# Patient Record
Sex: Male | Born: 1955 | Race: White | Hispanic: No | Smoking: Former smoker
Health system: Southern US, Community
[De-identification: ages and names within clinical notes are randomized; demographics above are authoritative.]

## PROBLEM LIST (undated history)

## (undated) DIAGNOSIS — M2550 Pain in unspecified joint: Secondary | ICD-10-CM

## (undated) DIAGNOSIS — F32A Depression, unspecified: Secondary | ICD-10-CM

## (undated) DIAGNOSIS — R269 Unspecified abnormalities of gait and mobility: Secondary | ICD-10-CM

## (undated) DIAGNOSIS — H04123 Dry eye syndrome of bilateral lacrimal glands: Secondary | ICD-10-CM

## (undated) DIAGNOSIS — F329 Major depressive disorder, single episode, unspecified: Secondary | ICD-10-CM

## (undated) DIAGNOSIS — I10 Essential (primary) hypertension: Secondary | ICD-10-CM

## (undated) DIAGNOSIS — R413 Other amnesia: Secondary | ICD-10-CM

## (undated) HISTORY — DX: Other amnesia: R41.3

## (undated) HISTORY — DX: Dry eye syndrome of bilateral lacrimal glands: H04.123

## (undated) HISTORY — DX: Unspecified abnormalities of gait and mobility: R26.9

## (undated) HISTORY — DX: Depression, unspecified: F32.A

## (undated) HISTORY — PX: HAND SURGERY: SHX662

## (undated) HISTORY — DX: Pain in unspecified joint: M25.50

---
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
## 1998-07-31 ENCOUNTER — Emergency Department (HOSPITAL_COMMUNITY): Admission: EM | Admit: 1998-07-31 | Discharge: 1998-07-31 | Payer: Self-pay | Admitting: Emergency Medicine

## 1998-07-31 ENCOUNTER — Emergency Department (HOSPITAL_COMMUNITY): Admission: EM | Admit: 1998-07-31 | Discharge: 1998-08-01 | Payer: Self-pay | Admitting: Emergency Medicine

## 1999-02-02 ENCOUNTER — Emergency Department (HOSPITAL_COMMUNITY): Admission: EM | Admit: 1999-02-02 | Discharge: 1999-02-03 | Payer: Self-pay | Admitting: Emergency Medicine

## 1999-03-22 ENCOUNTER — Emergency Department (HOSPITAL_COMMUNITY): Admission: EM | Admit: 1999-03-22 | Discharge: 1999-03-22 | Payer: Self-pay | Admitting: Emergency Medicine

## 2005-07-01 ENCOUNTER — Inpatient Hospital Stay (HOSPITAL_COMMUNITY): Admission: EM | Admit: 2005-07-01 | Discharge: 2005-07-08 | Payer: Self-pay | Admitting: Emergency Medicine

## 2007-07-30 ENCOUNTER — Emergency Department (HOSPITAL_COMMUNITY): Admission: EM | Admit: 2007-07-30 | Discharge: 2007-07-30 | Payer: Self-pay | Admitting: Emergency Medicine

## 2007-11-28 ENCOUNTER — Emergency Department (HOSPITAL_COMMUNITY): Admission: EM | Admit: 2007-11-28 | Discharge: 2007-11-28 | Payer: Self-pay | Admitting: Emergency Medicine

## 2011-01-16 ENCOUNTER — Emergency Department (HOSPITAL_COMMUNITY)
Admission: EM | Admit: 2011-01-16 | Discharge: 2011-01-17 | Disposition: A | Discharge status: Home / Self Care | Payer: Self-pay | Attending: Emergency Medicine | Admitting: Emergency Medicine

## 2011-01-16 DIAGNOSIS — Z79899 Other long term (current) drug therapy: Secondary | ICD-10-CM | POA: Insufficient documentation

## 2011-01-16 DIAGNOSIS — R000 Tachycardia, unspecified: Secondary | ICD-10-CM | POA: Insufficient documentation

## 2011-01-16 DIAGNOSIS — R51 Headache: Secondary | ICD-10-CM | POA: Insufficient documentation

## 2011-01-16 DIAGNOSIS — R002 Palpitations: Secondary | ICD-10-CM | POA: Insufficient documentation

## 2011-01-16 DIAGNOSIS — I10 Essential (primary) hypertension: Secondary | ICD-10-CM | POA: Insufficient documentation

## 2011-02-18 ENCOUNTER — Inpatient Hospital Stay (INDEPENDENT_AMBULATORY_CARE_PROVIDER_SITE_OTHER)
Admission: RE | Admit: 2011-02-18 | Discharge: 2011-02-18 | Disposition: A | Discharge status: Home / Self Care | Payer: Self-pay | Source: Ambulatory Visit | Attending: Family Medicine | Admitting: Family Medicine

## 2011-02-18 DIAGNOSIS — I10 Essential (primary) hypertension: Secondary | ICD-10-CM
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
## 2011-08-01 ENCOUNTER — Emergency Department (HOSPITAL_COMMUNITY)
Admission: EM | Admit: 2011-08-01 | Discharge: 2011-08-01 | Disposition: A | Discharge status: Home / Self Care | Payer: Self-pay | Attending: Emergency Medicine | Admitting: Emergency Medicine

## 2011-08-01 ENCOUNTER — Emergency Department (HOSPITAL_COMMUNITY): Payer: Self-pay

## 2011-08-01 DIAGNOSIS — S0003XA Contusion of scalp, initial encounter: Secondary | ICD-10-CM | POA: Insufficient documentation

## 2011-08-01 DIAGNOSIS — I10 Essential (primary) hypertension: Secondary | ICD-10-CM | POA: Insufficient documentation

## 2011-08-01 DIAGNOSIS — R279 Unspecified lack of coordination: Secondary | ICD-10-CM | POA: Insufficient documentation

## 2011-08-01 DIAGNOSIS — S0180XA Unspecified open wound of other part of head, initial encounter: Secondary | ICD-10-CM | POA: Insufficient documentation

## 2011-08-01 DIAGNOSIS — W11XXXA Fall on and from ladder, initial encounter: Secondary | ICD-10-CM | POA: Insufficient documentation

## 2011-08-01 DIAGNOSIS — F1010 Alcohol abuse, uncomplicated: Secondary | ICD-10-CM | POA: Insufficient documentation

## 2011-08-01 DIAGNOSIS — M542 Cervicalgia: Secondary | ICD-10-CM | POA: Insufficient documentation

## 2011-08-01 DIAGNOSIS — Z79899 Other long term (current) drug therapy: Secondary | ICD-10-CM | POA: Insufficient documentation

## 2011-08-01 DIAGNOSIS — R471 Dysarthria and anarthria: Secondary | ICD-10-CM | POA: Insufficient documentation

## 2011-09-12 LAB — BASIC METABOLIC PANEL
CO2: 25
Chloride: 108
GFR calc Af Amer: >60
GFR calc non Af Amer: >60
Sodium: 143

## 2011-09-12 LAB — RAPID URINE DRUG SCREEN, HOSP PERFORMED
Amphetamines: NOT DETECTED
Barbiturates: NOT DETECTED
Benzodiazepines: NOT DETECTED
Opiates: NOT DETECTED

## 2011-09-12 LAB — ETHANOL: Alcohol, Ethyl (B): 275 — ABNORMAL HIGH

## 2012-03-18 ENCOUNTER — Encounter (HOSPITAL_COMMUNITY): Payer: Self-pay | Admitting: Emergency Medicine

## 2012-03-18 ENCOUNTER — Emergency Department (INDEPENDENT_AMBULATORY_CARE_PROVIDER_SITE_OTHER)
Admission: EM | Admit: 2012-03-18 | Discharge: 2012-03-18 | Disposition: A | Discharge status: Home / Self Care | Payer: Self-pay | Source: Home / Self Care | Attending: Family Medicine | Admitting: Family Medicine

## 2012-03-18 DIAGNOSIS — S0502XA Injury of conjunctiva and corneal abrasion without foreign body, left eye, initial encounter: Secondary | ICD-10-CM

## 2012-03-18 DIAGNOSIS — S058X9A Other injuries of unspecified eye and orbit, initial encounter: Secondary | ICD-10-CM

## 2012-03-18 HISTORY — DX: Essential (primary) hypertension: I10

## 2012-03-18 MED ORDER — TETRACAINE HCL 0.5 % OP SOLN
OPHTHALMIC | Status: AC
  Filled 2012-03-18: qty 2

## 2012-03-18 MED ORDER — POLYMYXIN B-TRIMETHOPRIM 10000-0.1 UNIT/ML-% OP SOLN: 1.0000 [drp] | OPHTHALMIC | Status: AC

## 2012-03-18 NOTE — ED Notes (Addendum)
PT HERE WITH LEFT EYE IRRITATION WITH SENSITIVE TO LIGHT,DRY,ITCHY AND BLURRY VISION THAT STARTED X 2 DYS AGO.PT UNSURE IF FOREIGN OBJECT GOT IN EYE BUT DENIES DRAINAGE OR DIZZINESS.PT ALSO HAS ELEVATED BP 154/111 AND STATES I TAKE MY MEDS DAILY FOR BP.MILD H/A NOTED WITH SX'S.PT HAS BEEN USING EYE DROPS
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
## 2012-03-18 NOTE — Discharge Instructions (Signed)
Use eyedrops as directed. You may also flush eye regularly, using normal saline eye drops, PRIOR to applying the antibiotic drops. Exercise proper hygiene with handwashing, not touching the face or eye, not sharing towels or other clothing. Return to care, or call the Ophthalmology practice listed on your discharge papers, should your symptoms not improve, or worsen in any way, or any visual disturbance.

## 2012-03-25 ENCOUNTER — Emergency Department (HOSPITAL_COMMUNITY)
Admission: EM | Admit: 2012-03-25 | Discharge: 2012-03-25 | Disposition: A | Discharge status: Home / Self Care | Payer: Self-pay | Source: Home / Self Care | Attending: Emergency Medicine | Admitting: Emergency Medicine

## 2012-03-25 ENCOUNTER — Encounter (HOSPITAL_COMMUNITY): Payer: Self-pay | Admitting: Emergency Medicine

## 2012-03-25 DIAGNOSIS — S0502XA Injury of conjunctiva and corneal abrasion without foreign body, left eye, initial encounter: Secondary | ICD-10-CM

## 2012-03-25 DIAGNOSIS — S058X9A Other injuries of unspecified eye and orbit, initial encounter: Secondary | ICD-10-CM

## 2012-03-25 MED ORDER — CIPROFLOXACIN HCL 0.3 % OP SOLN: 1.0000 [drp] | OPHTHALMIC | Status: AC

## 2012-03-25 MED ORDER — TETRACAINE HCL 0.5 % OP SOLN
OPHTHALMIC | Status: AC
  Filled 2012-03-25: qty 2

## 2012-03-25 MED ORDER — HYDROCODONE-ACETAMINOPHEN 5-325 MG PO TABS: 2.0000 | ORAL_TABLET | ORAL | Status: AC | PRN

## 2012-03-25 NOTE — ED Provider Notes (Signed)
History     CSN: [id number]  Arrival date & time 03/25/12  1619   First MD Initiated Contact with Patient 03/25/12 1621      Chief Complaint  Patient presents with  . Eye Pain    (Consider location/radiation/quality/duration/timing/severity/associated sxs/prior treatment) HPI Comments: The patient reports he returned to the urgent care today with continued irritation of his left eye.  Was seen in the urgent care last week with the same problem.  States approximately 9 days ago, he felt that he got something in his eye, attempted to blink it out and wash out, but since then has had a foreign body sensation in his eye and intermittent sharp pains.  Patient has been using his antibiotic drops without relief.  He was given optho follow up but did not call.  Has associated blurry vision and occasional occipital headache.  Denies fevers, sensitivity to light, abdominal pain, N/V.  Denies double vision or any other focal neurological deficits.  Patient has significant history of blindness in right eye, resulting from head injury many years ago.  [facility]    Patient is a 56 y.o. male presenting with eye pain. The history is provided by the patient.  Eye Pain Associated symptoms include headaches. Pertinent negatives include no abdominal pain.    Past Medical History  Diagnosis Date  . Hypertension     Past Surgical History  Procedure Date  . Hand surgery     No family history on file.  History  Substance Use Topics  . Smoking status: Current Everyday Smoker  . Smokeless tobacco: Not on file  . Alcohol Use: Yes      Review of Systems  Constitutional: Negative for fever.  Eyes: Positive for pain, redness and visual disturbance. Negative for photophobia, discharge and itching.  Gastrointestinal: Negative for nausea, vomiting and abdominal pain.  Neurological: Positive for headaches. Negative for dizziness, weakness,  light-headedness and numbness.    Allergies  Review of patient's allergies indicates no known allergies.  Home Medications   Current Outpatient Rx  Name Route Sig Dispense Refill  . AMLODIPINE BESY-BENAZEPRIL HCL 10-20 MG PO CAPS Oral Take 1 capsule by mouth daily.    Marland Kitchen LISINOPRIL-HYDROCHLOROTHIAZIDE 20-12.5 MG PO TABS Oral Take 1 tablet by mouth daily.    Marland Kitchen POLYMYXIN B-TRIMETHOPRIM 10000-0.1 UNIT/ML-% OP SOLN Both Eyes Place 1 drop into both eyes every 4 (four) hours. 10 mL 0    BP 133/88  Pulse 90  Temp(Src) 98 F (36.7 C) (Oral)  Resp 14  SpO2 98%  Physical Exam  Nursing note and vitals reviewed. Constitutional: He is oriented to person, place, and time. He appears well-developed and well-nourished. No distress.  HENT:  Head: Normocephalic and atraumatic.  Eyes: EOM are normal. Pupils are equal, round, and reactive to light. No foreign bodies found. Right eye exhibits no discharge. Left eye exhibits no discharge. Right conjunctiva is not injected. Right conjunctiva has no hemorrhage. Left conjunctiva is injected. Left conjunctiva has no hemorrhage. No scleral icterus. Right eye exhibits normal extraocular motion and no nystagmus. Left eye exhibits normal extraocular motion and no nystagmus. Pupils are equal.  Slit lamp exam:      The left eye shows corneal abrasion.         Fundoscopic exam normal (performed without dilation)  Left eye pressure 17.    Neck: Neck supple.  Pulmonary/Chest: Effort normal.  Neurological: He is alert and oriented to  person, place, and time. He has normal strength. No cranial nerve deficit or sensory deficit. He exhibits normal muscle tone. Coordination and gait normal. GCS eye subscore is 4. GCS verbal subscore is 5. GCS motor subscore is 6.  Skin: He is not diaphoretic.  Psychiatric: He has a normal mood and affect. His behavior is normal. Judgment and thought content normal.    ED Course  Procedures (including critical care time)  Labs  Reviewed - No data to display No results found.  Tetanus is up to date  Patient's pain was relieved with tetracaine eye drops.   1. Corneal abrasion, left       MDM  Patient with left corneal abrasion seen last week at urgent care, returns today for lack of improvement.  States he did not follow up with the ophthalmologist because it did not get "worse."  I have spoken with him at length about the importance of close follow up with the ophthalmologist, especially because this is his only functional eye.  Pt does not have any focal neurological deficits that suggest stroke or other pathology.  No change in his symptoms.  His pain was completely relieved with tetracaine eye drops.  Small abrasion clearly seen over iris, (approximately 11:00).  I have added ciprofloxacin drops and have strongly advised that he call and see the ophthalmologist tomorrow.  Return precautions given. Patient verbalizes understanding and agrees with plan.          Dillard Cannon [place], [place] 03/25/12 2205

## 2012-03-25 NOTE — ED Notes (Signed)
Equipment placed at bedside:tetracaine, eye wash, eye box.

## 2012-03-25 NOTE — ED Notes (Signed)
C/o eye pain for 8 days.  [facility]  For the same.  Reports no improvement.  [facility].  Wanting something for discomfort.  Also wanting to make sure nothing in eye. Patient concerned eye does not seem to be improving .

## 2012-03-25 NOTE — Discharge Instructions (Signed)
It is very important that you call the ophthalmologist first thing tomorrow morning to schedule a close follow up appointment. You need to be seen in the next 1-2 days.  If you develop double vision, loss of vision, or increased pain, go to the ER immediately.  You may return to the urgent care at any time for worsening condition or any new symptoms that concern you.  Eye Injury The eye can be injured by scratches, foreign bodies, contact lenses, very bright light (welding torches), and chemical irritation. The cornea (the clear part of the eye) is very sensitive; even minor injuries to it are painful. Most injuries to the cornea heal in 2-4 days. Treatment may include:  Antibiotic eye drops or ointment may be needed to soothe the eye and prevent infection. Drops that numb the eye (anesthetic drops) should not be used repeatedly as they can delay healing. Drops to dilate the pupil for 1-2 days are sometimes used to relieve pain.   Patching the eye can reduce irritation from blinking and bright light. When your eye is patched, you should not drive or operate machinery because your side vision and your ability to judge distances are decreased.   Rest your eye. Stay in a darkened room and wear sunglasses to reduce the irritation from light.   Do not rub your eye for the next 2 weeks to allow complete healing.  If you have contact lenses, do not wear them until your caregiver says it is safe to do so. Pain medicine may also be needed for 1-2 days.  SEEK MEDICAL CARE IF:   You have increased pain, persistent irritation or blurred vision over the next 2 days.   Your symptoms are getting worse or not improving.   You have any other questions or concerns regarding your injury.  Document Released: 12/25/2004 Document Revised: 11/06/2011 Document Reviewed: 11/17/2005 [facility] Patient Information 2012 [place], [place]. Corneal Abrasion The cornea is the clear covering at the front and center of the eye.  When looking at the colored portion (iris) of the eye, you are looking through that person's cornea.  This very thin tissue is made up of many layers. The surface layer is a single layer of cells called the corneal epithelium. This is one of the most sensitive tissues in the body. If a scratch or injury causes the corneal epithelium to come off, it is called a corneal abrasion. If the injury extends to the tissues below the epithelium, the condition is called a corneal ulcer.  CAUSES   Scratches.   Trauma.   Foreign body in the eye.   Some people have recurrences of abrasions in the area of the original injury even after they heal. This is called recurrent erosion syndrome. Recurrent erosion syndromes generally improve and go away with time.  SYMPTOMS   Eye pain.   Difficulty or inability to keep the injured eye open.   The eye becomes very sensitive to light.   Recurrent erosions tend to happen suddenly, first thing in the morning - usually upon awakening and opening the eyes.  DIAGNOSIS  Your eye professional can diagnose a corneal abrasion during an eye exam. Dye is usually placed in the eye using a drop or a small paper strip moistened by the patient's tears. When the eye is examined with a special light, the abrasion shows up clearly because of the dye. TREATMENT   Small abrasions may be treated with antibiotic drops or ointment alone.   Usually a pressure  patch is specially applied. Pressure patches prevent the eye from blinking, allowing the corneal epithelium to heal. Because blinking is less, a pressure patch also reduces the amount of pain present in the eye during healing. Most corneal abrasions heal within 2-3 days with no effect on vision. WARNING: Do not drive or operate machinery while your eye is patched. Your ability to judge distances is impaired.   If abrasion becomes infected and spreads to the deeper tissues of the cornea, a corneal ulcer can result. This is serious  because it can cause corneal scarring. Corneal scars interfere with light passing through the cornea, and cause a loss of vision in the involved eye.   If your caregiver has given you a follow-up appointment, it is very important to keep that appointment. Not keeping the appointment could result in a severe eye infection or permanent loss of vision. If there is any problem keeping the appointment, you must call back to this facility for assistance.  SEEK MEDICAL CARE IF:   You have pain, light sensitivity and a scratchy feeling in one eye (or both).   Your pressure patch keeps loosening up and you can blink your eye under the patch after treatment.   Any kind of discharge develops from the involved eye after treatment or if the lids stick together in the morning.   You have the same symptoms in the morning as you did with the original abrasion days, weeks or months after the abrasion healed.  MAKE SURE YOU:   Understand these instructions.   Will watch your condition.   Will get help right away if you are not doing well or get worse.  Document Released: 11/14/2000 Document Revised: 11/06/2011 Document Reviewed: 06/22/2008 [facility] Patient Information 2012 [place], [place].

## 2012-03-27 NOTE — ED Provider Notes (Signed)
Medical screening examination/treatment/procedure(s) were performed by non-physician practitioner and as supervising physician I was immediately available for consultation/collaboration.  Leslee Home, M.D.   Reuben Likes, MD 03/27/12 [phone]

## 2012-07-17 ENCOUNTER — Emergency Department (HOSPITAL_COMMUNITY)
Admission: EM | Admit: 2012-07-17 | Discharge: 2012-07-17 | Disposition: A | Discharge status: Home / Self Care | Payer: Self-pay | Attending: Emergency Medicine | Admitting: Emergency Medicine

## 2012-07-17 ENCOUNTER — Encounter (HOSPITAL_COMMUNITY): Payer: Self-pay | Admitting: Emergency Medicine

## 2012-07-17 ENCOUNTER — Emergency Department (HOSPITAL_COMMUNITY): Payer: Self-pay

## 2012-07-17 DIAGNOSIS — M25559 Pain in unspecified hip: Secondary | ICD-10-CM | POA: Insufficient documentation

## 2012-07-17 DIAGNOSIS — F17200 Nicotine dependence, unspecified, uncomplicated: Secondary | ICD-10-CM | POA: Insufficient documentation

## 2012-07-17 DIAGNOSIS — I10 Essential (primary) hypertension: Secondary | ICD-10-CM | POA: Insufficient documentation

## 2012-07-17 MED ORDER — TRAMADOL HCL 50 MG PO TABS: 50.0000 mg | ORAL_TABLET | Freq: Four times a day (QID) | ORAL | Status: AC | PRN

## 2012-07-17 MED ORDER — KETOROLAC TROMETHAMINE 60 MG/2ML IM SOLN
60.0000 mg | Freq: Once | INTRAMUSCULAR | Status: AC
  Administered 2012-07-17: 60 mg via INTRAMUSCULAR
  Filled 2012-07-17: qty 2

## 2012-07-17 MED ORDER — HYDROCODONE-ACETAMINOPHEN 5-325 MG PO TABS
1.0000 | ORAL_TABLET | Freq: Once | ORAL | Status: AC
  Administered 2012-07-17: 1 via ORAL
  Filled 2012-07-17: qty 1

## 2012-07-17 NOTE — ED Notes (Signed)
Patient transported to X-ray 
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
## 2012-07-17 NOTE — ED Provider Notes (Signed)
History     CSN: [id number]  Arrival date & time 07/17/12  0130   First MD Initiated Contact with Patient 07/17/12 0205      Chief Complaint  Patient presents with  . Hip Pain    (Consider location/radiation/quality/duration/timing/severity/associated sxs/prior treatment) Patient is a 56 y.o. male presenting with hip pain.  Hip Pain This is a new problem. The current episode started 6 to 12 hours ago. The problem occurs constantly. The problem has not changed since onset.The symptoms are aggravated by twisting. Nothing relieves the symptoms. He has tried acetaminophen for the symptoms. The treatment provided no relief.    Past Medical History  Diagnosis Date  . Hypertension     Past Surgical History  Procedure Date  . Hand surgery     No family history on file.  History  Substance Use Topics  . Smoking status: Current Everyday Smoker  . Smokeless tobacco: Not on file  . Alcohol Use: Yes      Review of Systems  Musculoskeletal: Positive for arthralgias.  All other systems reviewed and are negative.    Allergies  Review of patient's allergies indicates no known allergies.  Home Medications   Current Outpatient Rx  Name Route Sig Dispense Refill  . ACETAMINOPHEN 500 MG PO TABS Oral Take 500 mg by mouth 2 (two) times daily.    Marland Kitchen AMLODIPINE BESYLATE 10 MG PO TABS Oral Take 10 mg by mouth daily.    . ASPIRIN 325 MG PO TABS Oral Take 325 mg by mouth daily.    Marland Kitchen LISINOPRIL-HYDROCHLOROTHIAZIDE 20-12.5 MG PO TABS Oral Take 1 tablet by mouth daily.      BP 142/102  Temp 98.3 F (36.8 C) (Oral)  Resp 18  SpO2 98%  Physical Exam  Constitutional: He is oriented to person, place, and time. He appears well-developed and well-nourished.  HENT:  Head: Normocephalic and atraumatic.  Eyes: Conjunctivae are normal. Pupils are equal, round, and reactive to light.  Neck: Normal range of motion. Neck supple.  Cardiovascular: Normal rate, regular rhythm, normal heart  sounds and intact distal pulses.   Pulmonary/Chest: Effort normal and breath sounds normal.  Abdominal: Soft. Bowel sounds are normal.  Neurological: He is alert and oriented to person, place, and time.  Skin: Skin is warm and dry.  Psychiatric: He has a normal mood and affect. His behavior is normal. Judgment and thought content normal.    ED Course  Procedures (including critical care time)  Labs Reviewed - No data to display No results found.   No diagnosis found.    MDM  Pt able ot ambulate.  No deformity noted  Will xray,  analgesia        Jolie Strohecker Lytle Michaels, MD 07/17/12 [phone]

## 2015-11-30 ENCOUNTER — Emergency Department (HOSPITAL_COMMUNITY)
Admission: EM | Admit: 2015-11-30 | Discharge: 2015-11-30 | Disposition: A | Discharge status: Home / Self Care | Payer: Self-pay | Attending: Emergency Medicine | Admitting: Emergency Medicine

## 2015-11-30 ENCOUNTER — Encounter (HOSPITAL_COMMUNITY): Payer: Self-pay

## 2015-11-30 DIAGNOSIS — I10 Essential (primary) hypertension: Secondary | ICD-10-CM | POA: Insufficient documentation

## 2015-11-30 DIAGNOSIS — F419 Anxiety disorder, unspecified: Secondary | ICD-10-CM | POA: Insufficient documentation

## 2015-11-30 DIAGNOSIS — Z79899 Other long term (current) drug therapy: Secondary | ICD-10-CM | POA: Insufficient documentation

## 2015-11-30 DIAGNOSIS — R51 Headache: Secondary | ICD-10-CM | POA: Insufficient documentation

## 2015-11-30 DIAGNOSIS — Z7982 Long term (current) use of aspirin: Secondary | ICD-10-CM | POA: Insufficient documentation

## 2015-11-30 DIAGNOSIS — F17210 Nicotine dependence, cigarettes, uncomplicated: Secondary | ICD-10-CM | POA: Insufficient documentation

## 2015-11-30 NOTE — ED Notes (Signed)
Pt hx hypertension and takes bp meds - did take meds today. At home, bp 170/120. Denies CP or shortness of breath

## 2015-11-30 NOTE — ED Provider Notes (Signed)
CSN: [id number]     Arrival date & time 11/30/15  1110 History   First MD Initiated Contact with Patient 11/30/15 1123     Chief Complaint  Patient presents with  . Hypertension   HPI   59 year old male with a history of hypertension presents today with hypertension. 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 He reports this causes stress and anxiety, he was not drinking sufficient amounts of water and he reports last night when he came home he was feeling tired to his blood pressure noted it was 160/110. Patient reports his blood pressure normally runs in the 130s and this reading was concerning to him. Patient reports today he feels better, when asked how he feels he states he feels "fine today". Patient reports a very mild, most unnoticeable posterior headache that per patient is present at times when his blood pressure runs slightly high. Patient denies any changes in smell vision or taste, any focal neurological deficits, chest pain, shortness of breath, abdominal pain, or any changes in the color clarity or characteristics of his urine. Patient reports that he takes HCTZ lisinopril and fluoxetine.   Past Medical History  Diagnosis Date  . Hypertension    Past Surgical History  Procedure Laterality Date  . Hand surgery     History reviewed. No pertinent family history. Social History  Substance Use Topics  . Smoking status: Current Every Day Smoker -- 1.00 packs/day    Types: Cigarettes  . Smokeless tobacco: None  . Alcohol Use: Yes    Review of Systems  All other systems reviewed and are negative.   Allergies  Review of patient's allergies indicates no known allergies.  Home Medications   Prior to Admission medications   Medication Sig Start Date End Date Taking? Authorizing Provider  acetaminophen (TYLENOL) 500 MG tablet Take 500 mg by mouth 2 (two) times daily.    Historical Provider, MD   amLODipine (NORVASC) 10 MG tablet Take 10 mg by mouth daily.    Historical Provider, MD  aspirin 325 MG tablet Take 325 mg by mouth daily.    Historical Provider, MD  lisinopril-hydrochlorothiazide (PRINZIDE,ZESTORETIC) 20-12.5 MG per tablet Take 1 tablet by mouth daily.    Historical Provider, MD   BP 130/94 mmHg  Pulse 90  Temp(Src) 97.9 F (36.6 C) (Oral)  Resp 16  Ht 5\' 9"  (1.753 m)  Wt 83.915 kg  BMI 27.31 kg/m2  SpO2 95%   Physical Exam  Constitutional: He is oriented to person, place, and time. He appears well-developed and well-nourished.  HENT:  Head: Normocephalic and atraumatic.  Eyes: Conjunctivae are normal. Pupils are equal, round, and reactive to light. Right eye exhibits no discharge. Left eye exhibits no discharge. No scleral icterus.  Neck: Normal range of motion. No JVD present. No tracheal deviation present.  Cardiovascular: Normal rate, regular rhythm, normal heart sounds and intact distal pulses.  Exam reveals no gallop and no friction rub.   No murmur heard. Pulmonary/Chest: Effort normal and breath sounds normal. No stridor. No respiratory distress. He has no wheezes. He has no rales. He exhibits no tenderness.  Neurological: He is alert and oriented to person, place, and time. He has normal strength. No cranial nerve deficit or sensory deficit. Coordination normal. GCS eye subscore is 4. GCS verbal subscore is 5. GCS motor subscore is 6.  Reflex Scores:      Patellar reflexes are 2+ on  the right side and 2+ on the left side. Psychiatric: He has a normal mood and affect. His behavior is normal. Judgment and thought content normal.  Nursing note and vitals reviewed.     ED Course  Procedures (including critical care time) Labs Review Labs Reviewed - No data to display  Imaging Review No results found. I have personally reviewed and evaluated these images and lab results as part of my medical decision-making.   EKG Interpretation None      MDM    Final diagnoses:  Essential hypertension    Labs:  Imaging:  Consults:  Therapeutics:  Discharge Meds:   Assessment/Plan: 59 year old male presents today with hypertension. Patient has no signs of end organ damage, originally his blood pressure 167/118, repeat shows reduction. Patient will be discharged home with instructions to monitor his blood pressure, follow-up with primary care if symptoms continue to persist or worsen. Patient verbalized understanding and agreement to today's plan and had no further questions concerns at time of discharge         Eyvonne Mechanic, PA-C 11/30/15 1647  Nelva Nay, MD 12/03/15 [phone]

## 2015-11-30 NOTE — Discharge Instructions (Signed)
Hypertension Hypertension, commonly called high blood pressure, is when the force of blood pumping through your arteries is too strong. Your arteries are the blood vessels that carry blood from your heart throughout your body. A blood pressure reading consists of a higher number over a lower number, such as 110/72. The higher number (systolic) is the pressure inside your arteries when your heart pumps. The lower number (diastolic) is the pressure inside your arteries when your heart relaxes. Ideally you want your blood pressure below 120/80. Hypertension forces your heart to work harder to pump blood. Your arteries may become narrow or stiff. Having untreated or uncontrolled hypertension can cause heart attack, stroke, kidney disease, and other problems. RISK FACTORS Some risk factors for high blood pressure are controllable. Others are not.  Risk factors you cannot control include:   Race. You may be at higher risk if you are African American.  Age. Risk increases with age.  Gender. Men are at higher risk than women before age 45 years. After age 65, women are at higher risk than men. Risk factors you can control include:  Not getting enough exercise or physical activity.  Being overweight.  Getting too much fat, sugar, calories, or salt in your diet.  Drinking too much alcohol. SIGNS AND SYMPTOMS Hypertension does not usually cause signs or symptoms. Extremely high blood pressure (hypertensive crisis) may cause headache, anxiety, shortness of breath, and nosebleed. DIAGNOSIS To check if you have hypertension, your health care provider will measure your blood pressure while you are seated, with your arm held at the level of your heart. It should be measured at least twice using the same arm. Certain conditions can cause a difference in blood pressure between your right and left arms. A blood pressure reading that is higher than normal on one occasion does not mean that you need treatment. If  it is not clear whether you have high blood pressure, you may be asked to return on a different day to have your blood pressure checked again. Or, you may be asked to monitor your blood pressure at home for 1 or more weeks. TREATMENT Treating high blood pressure includes making lifestyle changes and possibly taking medicine. Living a healthy lifestyle can help lower high blood pressure. You may need to change some of your habits. Lifestyle changes may include:  Following the DASH diet. This diet is high in fruits, vegetables, and whole grains. It is low in salt, red meat, and added sugars.  Keep your sodium intake below 2,300 mg per day.  Getting at least 30-45 minutes of aerobic exercise at least 4 times per week.  Losing weight if necessary.  Not smoking.  Limiting alcoholic beverages.  Learning ways to reduce stress. Your health care provider may prescribe medicine if lifestyle changes are not enough to get your blood pressure under control, and if one of the following is true:  You are 18-59 years of age and your systolic blood pressure is above 140.  You are 60 years of age or older, and your systolic blood pressure is above 150.  Your diastolic blood pressure is above 90.  You have diabetes, and your systolic blood pressure is over 140 or your diastolic blood pressure is over 90.  You have kidney disease and your blood pressure is above 140/90.  You have heart disease and your blood pressure is above 140/90. Your personal target blood pressure may vary depending on your medical conditions, your age, and other factors. HOME CARE INSTRUCTIONS    Have your blood pressure rechecked as directed by your health care provider.   Take medicines only as directed by your health care provider. Follow the directions carefully. Blood pressure medicines must be taken as prescribed. The medicine does not work as well when you skip doses. Skipping doses also puts you at risk for  problems.  Do not smoke.   Monitor your blood pressure at home as directed by your health care provider. SEEK MEDICAL CARE IF:   You think you are having a reaction to medicines taken.  You have recurrent headaches or feel dizzy.  You have swelling in your ankles.  You have trouble with your vision. SEEK IMMEDIATE MEDICAL CARE IF:  You develop a severe headache or confusion.  You have unusual weakness, numbness, or feel faint.  You have severe chest or abdominal pain.  You vomit repeatedly.  You have trouble breathing. MAKE SURE YOU:   Understand these instructions.  Will watch your condition.  Will get help right away if you are not doing well or get worse.   This information is not intended to replace advice given to you by your health care provider. Make sure you discuss any questions you have with your health care provider.   Document Released: 11/17/2005 Document Revised: 04/03/2015 Document Reviewed: 09/09/2013 [employer] Interactive Patient Education 2016 [employer]  Please read attached information. If you experience any new or worsening signs or symptoms please return to the emergency room for evaluation. Please follow-up with your primary care provider or specialist as discussed. Please use medication prescribed only as directed and discontinue taking if you have any concerning signs or symptoms.

## 2018-07-27 ENCOUNTER — Encounter (HOSPITAL_COMMUNITY): Payer: Self-pay | Admitting: Obstetrics and Gynecology

## 2018-07-27 ENCOUNTER — Emergency Department (HOSPITAL_COMMUNITY): Payer: No Typology Code available for payment source

## 2018-07-27 ENCOUNTER — Emergency Department (HOSPITAL_COMMUNITY)
Admission: EM | Admit: 2018-07-27 | Discharge: 2018-07-28 | Disposition: A | Discharge status: Home / Self Care | Payer: No Typology Code available for payment source | Attending: Emergency Medicine | Admitting: Emergency Medicine

## 2018-07-27 ENCOUNTER — Other Ambulatory Visit: Payer: Self-pay

## 2018-07-27 DIAGNOSIS — F17210 Nicotine dependence, cigarettes, uncomplicated: Secondary | ICD-10-CM | POA: Diagnosis not present

## 2018-07-27 DIAGNOSIS — M5441 Lumbago with sciatica, right side: Secondary | ICD-10-CM | POA: Insufficient documentation

## 2018-07-27 DIAGNOSIS — Z79899 Other long term (current) drug therapy: Secondary | ICD-10-CM | POA: Diagnosis not present

## 2018-07-27 DIAGNOSIS — M545 Low back pain: Secondary | ICD-10-CM | POA: Diagnosis present

## 2018-07-27 DIAGNOSIS — I10 Essential (primary) hypertension: Secondary | ICD-10-CM | POA: Insufficient documentation

## 2018-07-27 DIAGNOSIS — Z590 Homelessness: Secondary | ICD-10-CM | POA: Insufficient documentation

## 2018-07-27 MED ORDER — METHOCARBAMOL 500 MG PO TABS
1000.0000 mg | ORAL_TABLET | Freq: Once | ORAL | Status: AC
  Administered 2018-07-27: 1000 mg via ORAL
  Filled 2018-07-27: qty 2

## 2018-07-27 MED ORDER — LIDOCAINE 5 % EX PTCH
1.0000 | MEDICATED_PATCH | CUTANEOUS | Status: DC
  Administered 2018-07-27: 1 via TRANSDERMAL
  Filled 2018-07-27: qty 1

## 2018-07-27 MED ORDER — KETOROLAC TROMETHAMINE 30 MG/ML IJ SOLN
15.0000 mg | Freq: Once | INTRAMUSCULAR | Status: AC
  Administered 2018-07-27: 15 mg via INTRAMUSCULAR
  Filled 2018-07-27: qty 1

## 2018-07-27 MED ORDER — DEXAMETHASONE SODIUM PHOSPHATE 10 MG/ML IJ SOLN
10.0000 mg | Freq: Once | INTRAMUSCULAR | Status: AC
  Administered 2018-07-27: 10 mg via INTRAMUSCULAR
  Filled 2018-07-27: qty 1

## 2018-07-27 MED ORDER — MORPHINE SULFATE (PF) 4 MG/ML IV SOLN
4.0000 mg | Freq: Once | INTRAVENOUS | Status: AC
  Administered 2018-07-27: 4 mg via INTRAMUSCULAR
  Filled 2018-07-27: qty 1

## 2018-07-27 NOTE — ED Triage Notes (Addendum)
Per EMS: Pt is coming from home Pt reports he has chronic back pain. Pt reports he laid on the floor last week and believes he pinched a nerve. Pt reports the pain is too much and his mobility has decreased Pt reports she has seen his PCP already.

## 2018-07-27 NOTE — ED Notes (Addendum)
Patient living on floor of friend's shed since May. Patient does state that shed has air conditioning. Patient states he has aching pain 10/10 lower back radiating down right leg to knee cap. Patient states he fell yesterday trying to get from front door to back door trying to access friend's walker. Patient feels he needs to use walker because he feels unsteady on his feet.

## 2018-07-27 NOTE — ED Provider Notes (Signed)
[facility]-EMERGENCY DEPT Provider Note   CSN: [id number] Arrival date & time: 07/27/18  1849     History   Chief Complaint Chief Complaint  Patient presents with  . Back Pain    HPI [name] is a 62 y.o. male.  [name] is a 62 y.o. Male with history of hypertension and chronic back pain, presents to the emergency department for evaluation of severe low back pain radiating down into the right leg.  Pain is a constant ache across the low back worse over the right side that radiates into the buttock and over the lateral aspect of the right thigh, does not go past the knee.  Patient reports pain is worse with movement, particularly forward bending.  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  He denies numbness or weakness in either legs but does report increasing difficulty getting around due to pain, and that he has been using a walker at his friend's house, but is no longer able to stay there, is homeless with no where to go and no access to a walker or other medical equipment.  Patient reports earlier in the month he saw his primary care doctor regarding this back pain and was treated with a course of steroids, muscle relaxers and pain medication for 10 days which did improve his pain but when he ran out pain came back.  He is scheduled for outpatient MRI on September 5.  Has not had any other imaging thus far of his back since acute worsening of pain.  No loss of bowel or bladder control, no saddle anesthesia.  No associated abdominal pain or dysuria.  No fevers or chills.  No history of cancer or IV drug use.      Past Medical History:  Diagnosis Date  . Hypertension     There are no active problems to display  for this patient.   Past Surgical History:  Procedure Laterality Date  . HAND SURGERY          Home Medications    Prior to Admission medications   Medication Sig Start Date End Date Taking? Authorizing Provider  acetaminophen (TYLENOL) 500 MG tablet Take 500 mg by mouth 3 (three) times daily as needed for mild pain.   Yes [provider]  amLODipine (NORVASC) 5 MG tablet Take 5 mg by mouth daily.    Yes [provider]  buPROPion (WELLBUTRIN SR) 150 MG 12 hr tablet Take 150 mg by mouth 2 (two) times daily.   Yes [provider]  FLUoxetine (PROZAC) 20 MG tablet Take 20 mg by mouth daily.   Yes [provider]  lisinopril-hydrochlorothiazide (PRINZIDE,ZESTORETIC) 20-12.5 MG per tablet Take 1 tablet by mouth daily.   Yes [provider]  nicotine polacrilex (COMMIT) 2 MG lozenge Take 2 mg by mouth See admin instructions. 4 or 5 times daily   Yes [provider]    Family History No family history on file.  Social History Social History   Tobacco Use  . Smoking status: Current Every Day Smoker    Packs/day: 1.00    Types: Cigarettes  Substance Use Topics  . Alcohol use: Yes    Comment: Pt reports social  .  Drug use: No     Allergies   Patient has no known allergies.   Review of Systems Review of Systems  Constitutional: Negative for chills and fever.  HENT: Negative.   Eyes: Negative for visual disturbance.  Respiratory: Negative for cough and shortness of breath.   Cardiovascular: Negative for chest pain.  Gastrointestinal: Negative for abdominal pain, blood in stool, constipation, diarrhea, nausea and vomiting.  Genitourinary: Negative for dysuria, flank pain, frequency and hematuria.  Musculoskeletal: Positive for back pain.  Skin: Negative for color change and rash.  Neurological: Negative for dizziness, syncope, weakness, light-headedness and numbness.     Physical Exam Updated Vital Signs BP (!)  140/98 (BP Location: Left Arm)   Pulse (!) 101   Temp 97.9 F (36.6 C) (Oral)   Resp 18   Ht 5\' 9"  (1.753 m)   Wt 83.9 kg   SpO2 97%   BMI 27.32 kg/m   Physical Exam  Constitutional: He is oriented to person, place, and time. He appears well-developed and well-nourished. No distress.  HENT:  Head: Normocephalic and atraumatic.  Mouth/Throat: Oropharynx is clear and moist.  Eyes: Right eye exhibits no discharge. Left eye exhibits no discharge.  Neck: Neck supple.  Cardiovascular: Normal rate, regular rhythm, normal heart sounds and intact distal pulses. Exam reveals no gallop and no friction rub.  No murmur heard. Pulmonary/Chest: Effort normal and breath sounds normal. No respiratory distress.  Respirations equal and unlabored, patient able to speak in full sentences, lungs clear to auscultation bilaterally  Abdominal: Soft. Bowel sounds are normal. He exhibits no distension and no mass. There is no tenderness. There is no guarding.  Respirations equal and unlabored, patient able to speak in full sentences, lungs clear to auscultation bilaterally  Musculoskeletal:  Tenderness to palpation over the right lower back without palpable deformity, pain worse with range of motion of the lower extremities with positive straight leg raise on the right.  Patient able to move bilateral extremities with good strength in proximal and distal muscle groups.  2+ DP and TP pulses extremities are warm and well-perfused.  Neurological: He is alert and oriented to person, place, and time. Coordination normal.  Speech is clear, able to follow commands CN III-XII intact Normal strength in upper and lower extremities bilaterally including dorsiflexion and plantar flexion, strong and equal grip strength Sensation normal to light and sharp touch 2+ patellar DTRs bilaterally Moves extremities without ataxia, coordination intact  Skin: Skin is warm and dry. Capillary refill takes less than 2 seconds. He is not  diaphoretic.  Psychiatric: He has a normal mood and affect. His behavior is normal.  Nursing note and vitals reviewed.    ED Treatments / Results  Labs (all labs ordered are listed, but only abnormal results are displayed) Labs Reviewed - No data to display  EKG None  Radiology Dg Lumbar Spine Complete  Result Date: 07/27/2018 CLINICAL DATA:  Low back pain EXAM: LUMBAR SPINE - COMPLETE 4+ VIEW COMPARISON:  None. 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 No fracture or subluxation. SI joints are symmetric and unremarkable. IMPRESSION: Degenerative changes.  No acute findings. Electronically Signed   By: Charlett NoseKevin  Dover M.D.   On: 07/27/2018 23:44    Procedures Procedures (including critical care time)  Medications Ordered in ED Medications  lidocaine (LIDODERM) 5 % 1 patch (1 patch Transdermal Patch Applied 07/27/18 2246)  ketorolac (TORADOL) 30 MG/ML injection 15 mg (15 mg Intramuscular Given 07/27/18 2247)  methocarbamol (ROBAXIN) tablet 1,000 mg (1,000 mg Oral Given 07/27/18 2248)  morphine 4 MG/ML injection 4 mg (4 mg Intramuscular Given 07/27/18 2247)  dexamethasone (DECADRON) injection 10 mg (10 mg Intramuscular Given 07/27/18 2247)     Initial Impression / Assessment and Plan / ED Course  I have reviewed the triage vital signs and the nursing notes.  Pertinent labs & imaging results that were available during my care of the patient were reviewed by me and considered in my medical decision making (see chart for details).  Patient presents with acute on chronic right low back pain radiating into the right leg.  No numbness, weakness, loss of bowel or bladder control or saddle anesthesia, no concern for cauda equina syndrome.  No neurologic deficits noted on exam.  Abdominal exam is benign.  Patient initially mildly tachycardic, but this resolved, and I feel this is likely due to pain response.  Will get lumbar spine x-rays, morphine,  Robaxin, Toradol, Decadron and lidocaine patch provided for pain management.  X-ray shows chronic degenerative changes without acute fracture or bony abnormality.  On reevaluation patient reports improvement ambulated in the hallway with myself and nursing staff, and patient did require some assistance, has been walking with a walker for the past few weeks which she no longer has access to, patient is homeless, and is no longer able to stay with his friend and is trying to make it to [place] for continued treatment at the [place].  Given difficulty with ambulation and nowhere to go feel patient will need to be observed overnight with social work and case management consults in the morning to help with resources and potentially obtaining walker for patient for safe for disposition.  At shift change care signed out to PA [facility] pending social work and case management consults.  Final Clinical Impressions(s) / ED Diagnoses   Final diagnoses:  Right-sided low back pain with right-sided sciatica, unspecified chronicity    ED Discharge Orders    None       Dartha Lodge, [place] 07/28/18 0124    Little, Ambrose Finland, MD 07/31/18 [phone]

## 2018-07-28 NOTE — ED Notes (Signed)
Pt is alert and oriented x 4 and is verbally responsive. MSW in with pt at this time.

## 2018-07-28 NOTE — Care Management Note (Addendum)
Case Management Note  CM consulted for DME walker need.  Pt is VA covered and will have to get [facility].  ED CSW will advised [facility] of [facility]'s need when she contacts them for further information on a transition of care plan.  Updated Geiple, PA and pt at bedside who understood the walker had to come from the [facility].  No further CM needs noted at this time.  Loc Feinstein, Lynnae SandhoffAngela N, RN 07/28/2018, 9:57 AM

## 2018-07-28 NOTE — ED Notes (Signed)
SW in to speak with pt about home situation

## 2018-07-28 NOTE — ED Provider Notes (Signed)
Handoff from previous provider at shift change.  Patient awaiting social work and Sports coachcase manager consult.  Hopefully we will be able to obtain a walker for him.  Case manager and social work have been working on this case throughout the day today.  They have been able to produce a walker for the patient to use.  They have helped him get a spot in a shelter tonight and provided a taxi voucher.  If he goes to [facility] tomorrow, he should be able to be placed.  Pt will be discharged.   BP (!) 131/92 (BP Location: Right Arm)   Pulse 99   Temp 98.5 F (36.9 C) (Oral)   Resp 18   Ht 5\' 9"  (1.753 m)   Wt 83.9 kg   SpO2 96%   BMI 27.32 kg/m     Renne CriglerGeiple, Jorell Agne, PA-C 07/28/18 1553    Lorre NickAllen, Anthony, MD 07/30/18 [phone]

## 2018-07-28 NOTE — Progress Notes (Addendum)
CSW aware of consult. CSW spoke with patient at bedside. Patient reported that he's been having chronic back pain and that at the beginning of the month he was helping a friend fix her floors and he thinks he pinched a nerve. Patient stated he sometimes uses a walker but doesn't want to rely on it. Per patient, he was living with a friend in her shed in [place] but is unable to return there. Patient stated that [facility] is [facility] with the [facility] and has been working with Abelardo DieselErica Hoskins, Child psychotherapistsocial worker with the [facility] [place]. Patient currently trying to obtain number for CSW to reach out to [place]. CSW will continue to follow for discharge needs.  10:47am- CSW spoke with patient at bedside who is now reporting he cannot walk. CSW also spoke with Jayme Cloudina Duncan, lead social worker with the VA ph:[phone] ext. [phone]. Per Inetta Fermoina, patient is non-service connected but does receive medical care and medicine through the [facility].   11:54am- CSW spoke with Abelardo DieselErica Hoskins who stated that patient may be able to [facility] but that he would need to [facility] substance abuse assessment tomorrow 8/28. Per Alcario DroughtErica, his only option at this time is to go to a shelter in the meantime. CSW to provide patient with shelter resources.   Archie BalboaMackenzie Irwin, LCSWA  Clinical Social Work Department  [facility]  [phone]

## 2018-07-28 NOTE — ED Notes (Signed)
Diet tray provided

## 2018-07-28 NOTE — Progress Notes (Signed)
CSW spoke with Will of [facility] who stated they have a bed available for patient. CSW informed Will that patient walks with a walker. Per Will, they do not normally accommodate patient's with a walker but is agreeable to take patient as patient will [facility] be [facility], by next week, pending patient goes for his SA assessment tomorrow at the [facility] [place].  Patient provided with walker and taxi voucher to get to [facility]. No further CSW needs at this time. Please reconsult if needs arise.  Archie BalboaMackenzie Irwin, LCSWA  Clinical Social Work Department  [facility]  [phone]

## 2018-07-28 NOTE — ED Provider Notes (Signed)
6:10 AM Patient presenting for ongoing back pain.  Evaluation has been reassuring, but patient does require device for assistance in ambulation.  He is also homeless.  Pending care management and social work consultations.  Signed out to Wal-MartJosh Geiple, PA-C at change of shift.   Antony MaduraHumes, Taleisha Kaczynski, PA-C 07/28/18 [id number]    Paula LibraMolpus, John, MD 07/28/18 [phone]

## 2018-07-28 NOTE — ED Notes (Signed)
Pt resting at present, aware he is waiting for SW to come provide walker and ? Place of residence.

## 2018-08-01 HISTORY — PX: BACK SURGERY: SHX140

## 2019-03-11 ENCOUNTER — Emergency Department (HOSPITAL_COMMUNITY)
Admission: EM | Admit: 2019-03-11 | Discharge: 2019-03-11 | Disposition: A | Discharge status: Home / Self Care | Payer: No Typology Code available for payment source | Attending: Emergency Medicine | Admitting: Emergency Medicine

## 2019-03-11 ENCOUNTER — Other Ambulatory Visit: Payer: Self-pay

## 2019-03-11 ENCOUNTER — Emergency Department (HOSPITAL_COMMUNITY): Payer: No Typology Code available for payment source

## 2019-03-11 ENCOUNTER — Encounter (HOSPITAL_COMMUNITY): Payer: Self-pay | Admitting: Emergency Medicine

## 2019-03-11 DIAGNOSIS — I491 Atrial premature depolarization: Secondary | ICD-10-CM | POA: Insufficient documentation

## 2019-03-11 DIAGNOSIS — R002 Palpitations: Secondary | ICD-10-CM

## 2019-03-11 DIAGNOSIS — R072 Precordial pain: Secondary | ICD-10-CM | POA: Insufficient documentation

## 2019-03-11 DIAGNOSIS — Z79899 Other long term (current) drug therapy: Secondary | ICD-10-CM | POA: Diagnosis not present

## 2019-03-11 DIAGNOSIS — F17210 Nicotine dependence, cigarettes, uncomplicated: Secondary | ICD-10-CM | POA: Diagnosis not present

## 2019-03-11 DIAGNOSIS — I10 Essential (primary) hypertension: Secondary | ICD-10-CM | POA: Diagnosis not present

## 2019-03-11 LAB — CBC
HCT: 43 % (ref 39.0–52.0)
Hemoglobin: 14.1 g/dL (ref 13.0–17.0)
MCH: 29.9 pg (ref 26.0–34.0)
MCHC: 32.8 g/dL (ref 30.0–36.0)
MCV: 91.1 fL (ref 80.0–100.0)
Platelets: 270 10*3/uL (ref 150–400)
RBC: 4.72 MIL/uL (ref 4.22–5.81)
RDW: 14.5 % (ref 11.5–15.5)
WBC: 6.8 10*3/uL (ref 4.0–10.5)
nRBC: 0 % (ref 0.0–0.2)

## 2019-03-11 LAB — BASIC METABOLIC PANEL
Anion gap: 12 (ref 5–15)
BUN: 23 mg/dL (ref 8–23)
CO2: 21 mmol/L — ABNORMAL LOW (ref 22–32)
Calcium: 9.5 mg/dL (ref 8.9–10.3)
Chloride: 106 mmol/L (ref 98–111)
Creatinine, Ser: 1.4 mg/dL — ABNORMAL HIGH (ref 0.61–1.24)
GFR calc Af Amer: >60 mL/min (ref >60)
GFR calc non Af Amer: 54 mL/min — ABNORMAL LOW (ref >60)
Glucose, Bld: 96 mg/dL (ref 70–99)
Potassium: 3.9 mmol/L (ref 3.5–5.1)
Sodium: 139 mmol/L (ref 135–145)

## 2019-03-11 LAB — TROPONIN I
Troponin I: <0.03 ng/mL (ref <0.03)
Troponin I: <0.03 ng/mL (ref <0.03)

## 2019-03-11 MED ORDER — SODIUM CHLORIDE 0.9% FLUSH
3.0000 mL | Freq: Once | INTRAVENOUS | Status: AC
  Administered 2019-03-11: 20:00:00 3 mL via INTRAVENOUS

## 2019-03-11 MED ORDER — METOPROLOL TARTRATE 5 MG/5ML IV SOLN
2.5000 mg | Freq: Once | INTRAVENOUS | Status: AC
  Administered 2019-03-11: 2.5 mg via INTRAVENOUS
  Filled 2019-03-11: qty 5

## 2019-03-11 NOTE — ED Triage Notes (Signed)
Pt arrives by [facility] from [place] [facility]- started having chest pain and palpations this am. Abnormal EKG.

## 2019-03-11 NOTE — ED Notes (Signed)
Patient verbalizes understanding of medications and discharge instructions. No further questions at this time. VSS and patient ambulatory at discharge.   

## 2019-03-12 NOTE — ED Provider Notes (Signed)
[facility] EMERGENCY DEPARTMENT Provider Note   CSN: [id number] Arrival date & time: 03/11/19  1703    History   Chief Complaint Chief Complaint  Patient presents with  . Palpitations    HPI [name] is a 63 y.o. male.     HPI Patient is a 63 year old male presents the emergency department complaints of palpitations this morning with associated chest tightness and chest pain.  He states this lasted through the majority the day.  He does admit to drinking increased levels of caffeine over the past several days.  He was evaluated by EMS and was told that he had lots of extra beats.  No other recent changes medications.  No lightheadedness or syncope.  No chest discomfort at this time.  He is beginning to feel better.  On monitor while performing a history on the patient he has multiple PACs.  No unilateral leg swelling.  No abdominal pain.  He does have a history of coronary artery disease.  He is compliant with his medications otherwise   Past Medical History:  Diagnosis Date  . Hypertension     There are no active problems to display for this patient.   Past Surgical History:  Procedure Laterality Date  . HAND SURGERY          Home Medications    Prior to Admission medications   Medication Sig Start Date End Date Taking? Authorizing Provider  acetaminophen (TYLENOL) 500 MG tablet Take 500 mg by mouth 3 (three) times daily as needed for mild pain.   Yes [provider]  amLODipine (NORVASC) 5 MG tablet Take 5 mg by mouth daily.    Yes [provider]  buPROPion (WELLBUTRIN SR) 150 MG 12 hr tablet Take 150 mg by mouth 2 (two) times daily.   Yes [provider]  FLUoxetine (PROZAC) 20 MG capsule Take 20 mg by mouth daily.   Yes [provider]  gabapentin (NEURONTIN) 100 MG capsule Take 100 mg by mouth 3 (three) times daily.   Yes [provider]  lisinopril-hydrochlorothiazide (PRINZIDE,ZESTORETIC)  20-12.5 MG per tablet Take 1 tablet by mouth daily.   Yes [provider]  Melatonin 3 MG TABS Take 6 mg by mouth at bedtime.   Yes [provider]  nicotine polacrilex (COMMIT) 2 MG lozenge Take 2 mg by mouth every 2 (two) hours as needed for smoking cessation.    Yes [provider]  Polyethyl Glycol-Propyl Glycol (SYSTANE ULTRA) 0.4-0.3 % SOLN Place 1 drop into both eyes 3 (three) times daily.   Yes [provider]  Throat Lozenges (COUGH DROPS MENTHOL MT) 1 drop 5 (five) times daily as needed (for throat dryness).    Yes [provider]    Family History No family history on file.  Social History Social History   Tobacco Use  . Smoking status: Current Every Day Smoker    Packs/day: 1.00    Types: Cigarettes  Substance Use Topics  . Alcohol use: Yes    Comment: Pt reports social  . Drug use: No     Allergies   Patient has no known allergies.   Review of Systems Review of Systems  All other systems reviewed and are negative.    Physical Exam Updated Vital Signs BP 128/74   Pulse 74   Temp 97.7 F (36.5 C) (Oral)   Resp 17   Ht 5\' 9"  (1.753 m)   Wt 93 kg   SpO2 97%  BMI 30.27 kg/m   Physical Exam Vitals signs and nursing note reviewed.  Constitutional:      Appearance: He is well-developed.  HENT:     Head: Normocephalic and atraumatic.  Neck:     Musculoskeletal: Normal range of motion.  Cardiovascular:     Rate and Rhythm: Normal rate and regular rhythm.     Heart sounds: Normal heart sounds.  Pulmonary:     Effort: Pulmonary effort is normal. No respiratory distress.     Breath sounds: Normal breath sounds.  Abdominal:     General: There is no distension.     Palpations: Abdomen is soft.     Tenderness: There is no abdominal tenderness.  Musculoskeletal: Normal range of motion.  Skin:    General: Skin is warm and dry.  Neurological:     Mental Status: He is alert and oriented to person, place, and  time.  Psychiatric:        Judgment: Judgment normal.      ED Treatments / Results  Labs (all labs ordered are listed, but only abnormal results are displayed) Labs Reviewed  BASIC METABOLIC PANEL - Abnormal; Notable for the following components:      Result Value   CO2 21 (*)    Creatinine, Ser 1.40 (*)    GFR calc non Af Amer 54 (*)    All other components within normal limits  CBC  TROPONIN I  TROPONIN I    EKG EKG Interpretation  Date/Time:  Friday March 11 2019 17:06:28 EDT Ventricular Rate:  118 PR Interval:  160 QRS Duration: 88 QT Interval:  334 QTC Calculation: 468 R Axis:   44 Text Interpretation:  Sinus tachycardia with Premature atrial complexes in a pattern of bigeminy Otherwise normal ECG ECG OTHERWISE WITHIN NORMAL LIMITS Confirmed by Azalia Bilis ([id number]) on 03/11/2019 6:43:49 PM Also confirmed by Azalia Bilis ([id number]), editor Sheppard Evens [phone])  on 03/12/2019 12:57:18 PM   Radiology Dg Chest 2 View  Result Date: 03/11/2019 CLINICAL DATA:  Chest pain EXAM: CHEST - 2 VIEW COMPARISON:  07/01/2005 FINDINGS: The heart size and mediastinal contours are within normal limits. Both lungs are clear. The visualized skeletal structures are unremarkable. IMPRESSION: No active cardiopulmonary disease. Electronically Signed   By: Marlan Palau M.D.   On: 03/11/2019 18:51    Procedures Procedures (including critical care time)  Medications Ordered in ED Medications  sodium chloride flush (NS) 0.9 % injection 3 mL (3 mLs Intravenous Given 03/11/19 1940)  metoprolol tartrate (LOPRESSOR) injection 2.5 mg (2.5 mg Intravenous Given 03/11/19 1939)     Initial Impression / Assessment and Plan / ED Course  I have reviewed the triage vital signs and the nursing notes.  Pertinent labs & imaging results that were available during my care of the patient were reviewed by me and considered in my medical decision making (see chart for details).        On arrival the  patient is having multiple PACs.  He was given 2.5 mg of metoprolol with resolution of his PACs.  He is feeling much better.  Troponin is negative x2.  Work-up is otherwise without abnormality.  Doubt ACS.  Doubt PE.  No signs to suggest aortic dissection.  I recommended decreasing his caffeine intake which likely was leading to increased ectopy.  He understands to follow-up with his primary care physician.  He understands to return to the ER for new or worsening symptoms  Final Clinical Impressions(s) / ED Diagnoses  Final diagnoses:  Precordial chest pain  Palpitations  PAC (premature atrial contraction)    ED Discharge Orders    None       Azalia Bilisampos, Lillia Lengel, MD 03/12/19 2249

## 2019-03-22 ENCOUNTER — Emergency Department (HOSPITAL_COMMUNITY)
Admission: EM | Admit: 2019-03-22 | Discharge: 2019-03-23 | Disposition: A | Discharge status: Home / Self Care | Payer: No Typology Code available for payment source | Attending: Emergency Medicine | Admitting: Emergency Medicine

## 2019-03-22 DIAGNOSIS — R002 Palpitations: Secondary | ICD-10-CM | POA: Insufficient documentation

## 2019-03-23 ENCOUNTER — Encounter (HOSPITAL_COMMUNITY): Payer: Self-pay | Admitting: Emergency Medicine

## 2019-03-23 ENCOUNTER — Emergency Department (HOSPITAL_COMMUNITY): Payer: No Typology Code available for payment source

## 2019-03-23 ENCOUNTER — Other Ambulatory Visit: Payer: Self-pay

## 2019-03-23 LAB — BASIC METABOLIC PANEL
Anion gap: 13 (ref 5–15)
BUN: 28 mg/dL — ABNORMAL HIGH (ref 8–23)
CO2: 20 mmol/L — ABNORMAL LOW (ref 22–32)
Calcium: 9.4 mg/dL (ref 8.9–10.3)
Chloride: 107 mmol/L (ref 98–111)
Creatinine, Ser: 1.5 mg/dL — ABNORMAL HIGH (ref 0.61–1.24)
GFR calc Af Amer: 57 mL/min — ABNORMAL LOW (ref >60)
GFR calc non Af Amer: 50 mL/min — ABNORMAL LOW (ref >60)
Glucose, Bld: 118 mg/dL — ABNORMAL HIGH (ref 70–99)
Potassium: 3.9 mmol/L (ref 3.5–5.1)
Sodium: 140 mmol/L (ref 135–145)

## 2019-03-23 LAB — CBC
HCT: 42.3 % (ref 39.0–52.0)
Hemoglobin: 14.5 g/dL (ref 13.0–17.0)
MCH: 30.9 pg (ref 26.0–34.0)
MCHC: 34.3 g/dL (ref 30.0–36.0)
MCV: 90 fL (ref 80.0–100.0)
Platelets: 267 10*3/uL (ref 150–400)
RBC: 4.7 MIL/uL (ref 4.22–5.81)
RDW: 14.2 % (ref 11.5–15.5)
WBC: 5.5 10*3/uL (ref 4.0–10.5)
nRBC: 0 % (ref 0.0–0.2)

## 2019-03-23 LAB — T4, FREE: Free T4: 0.96 ng/dL (ref 0.82–1.77)

## 2019-03-23 LAB — TROPONIN I: Troponin I: <0.03 ng/mL (ref <0.03)

## 2019-03-23 LAB — TSH: TSH: 1.56 u[IU]/mL (ref 0.350–4.500)

## 2019-03-23 MED ORDER — METOPROLOL TARTRATE 5 MG/5ML IV SOLN
2.5000 mg | Freq: Once | INTRAVENOUS | Status: AC
  Administered 2019-03-23: 2.5 mg via INTRAVENOUS
  Filled 2019-03-23: qty 5

## 2019-03-23 NOTE — ED Notes (Signed)
Patient verbalizes understanding of discharge instructions. Opportunity for questioning and answers were provided. Armband removed by staff, pt discharged from ED home via POV.  

## 2019-03-23 NOTE — ED Notes (Signed)
Pt reports feeling much better. Pt denies any pain at this time.

## 2019-03-23 NOTE — ED Triage Notes (Signed)
Pt BIB [facility] for flutter in his chest. Pt reported to EMS he has a device that monitors his HR. EMS reports pt was in Sinus tach with PAC's. 12 Lead unremarkable. EMS reports pt has SOB. EMS reports the pt was also evaluated for same on 4/10. EMS reports pain is a 3 and the SOB is related to the mask. EMS vitals were stable as noted. IV established as noted. EMS reports denying NVD or recent cough.   Pt reports having fluttering or palpitations in his chest. Pt reports eating some chocolate and shortly after this started to happen. Pt reports having a monitoring device and noticed his heart rate was in the 120's. Pt reports a discomfort of a 3, calls it annoying pain.

## 2019-03-23 NOTE — ED Notes (Signed)
Patient transported to X-ray 

## 2019-03-23 NOTE — Discharge Instructions (Signed)
Continue to watch your caffeine intake.  Remember, chocolate has caffeine in it in variable amounts. [facility] make sure to follow-up on the cardiology referral.  Copies of labs on back from today's visit. Return here for any new/acute changes.

## 2019-03-23 NOTE — ED Provider Notes (Signed)
[facility] Provider Note   CSN: [id number] Arrival date & time: 03/22/19  2357    History   Chief Complaint Chief Complaint  Patient presents with  . Palpitations    HPI [name] is a 63 y.o. male.     The history is provided by the patient and medical records.  Palpitations     63 year old male with history of hypertension, presenting to the ED with palpitations and rapid heart rate.  States he was seen here on 03/11/2019 same, given Lopressor here which seemed to help a great deal.  States since discharge she has been trying to modify his caffeine intake as he was told this may be contributing, however has always drank coffee and this is a hard habit to break.  States tonight he ate some brownie bites prior to going to bed and shortly after he developed palpitations.  States it feels identical to prior episode earlier in the month.  States he has a "annoyance" of discomfort in his chest but denies any true chest pain.  States he has felt a little winded since palpitations began and feels dizzy if moving around too quickly.  Is not had any syncopal events.  No nausea or vomiting.  No diaphoresis.  He has no known cardiac history.  He is not a smoker.  Denies any illicit drug use.  Denies any known thyroid disease.  He has not had any extensive medical evaluation since September 2019 when he was hospitalized for sepsis.  Past Medical History:  Diagnosis Date  . Hypertension     There are no active problems to display for this patient.   Past Surgical History:  Procedure Laterality Date  . HAND SURGERY          Home Medications    Prior to Admission medications   Medication Sig Start Date End Date Taking? Authorizing Provider  acetaminophen (TYLENOL) 500 MG tablet Take 500 mg by mouth 3 (three) times daily as needed for mild pain.    [provider]  amLODipine (NORVASC) 5 MG tablet Take 5 mg by mouth daily.      [provider]  buPROPion (WELLBUTRIN SR) 150 MG 12 hr tablet Take 150 mg by mouth 2 (two) times daily.    [provider]  FLUoxetine (PROZAC) 20 MG capsule Take 20 mg by mouth daily.    [provider]  gabapentin (NEURONTIN) 100 MG capsule Take 100 mg by mouth 3 (three) times daily.    [provider]  lisinopril-hydrochlorothiazide (PRINZIDE,ZESTORETIC) 20-12.5 MG per tablet Take 1 tablet by mouth daily.    [provider]  Melatonin 3 MG TABS Take 6 mg by mouth at bedtime.    [provider]  nicotine polacrilex (COMMIT) 2 MG lozenge Take 2 mg by mouth every 2 (two) hours as needed for smoking cessation.     [provider]  Polyethyl Glycol-Propyl Glycol (SYSTANE ULTRA) 0.4-0.3 % SOLN Place 1 drop into both eyes 3 (three) times daily.    [provider]  Throat Lozenges (COUGH DROPS MENTHOL MT) 1 drop 5 (five) times daily as needed (for throat dryness).     [provider]    Family History No family history on file.  Social History Social History   Tobacco Use  . Smoking status: Current Every Day Smoker    Packs/day: 1.00    Types: Cigarettes  . Smokeless tobacco: Never Used  Substance Use Topics  .  Alcohol use: Yes    Comment: Pt reports social  . Drug use: No     Allergies   Patient has no known allergies.   Review of Systems Review of Systems  Cardiovascular: Positive for palpitations.  All other systems reviewed and are negative.    Physical Exam Updated Vital Signs BP (!) 121/91   Pulse 60   Temp 98 F (36.7 C) (Oral)   Resp 12   Ht  (1.753 m)   Wt 93 kg   SpO2 96%   BMI 30.27 kg/m   Physical Exam Vitals signs and nursing note reviewed.  Constitutional:      Appearance: He is well-developed.  HENT:     Head: Normocephalic and atraumatic.  Eyes:     Conjunctiva/sclera: Conjunctivae normal.     Pupils: Pupils are equal, round, and reactive to light.  Neck:      Musculoskeletal: Normal range of motion.  Cardiovascular:     Rate and Rhythm: Regular rhythm. Tachycardia present.     Heart sounds: Normal heart sounds.     Comments: Tachycardic to 108-115 during exam, PACs noted on monitor Pulmonary:     Effort: Pulmonary effort is normal.     Breath sounds: Normal breath sounds.  Abdominal:     General: Bowel sounds are normal.     Palpations: Abdomen is soft.  Musculoskeletal: Normal range of motion.  Skin:    General: Skin is warm and dry.  Neurological:     Mental Status: He is alert and oriented to person, place, and time.      ED Treatments / Results  Labs (all labs ordered are listed, but only abnormal results are displayed) Labs Reviewed  BASIC METABOLIC PANEL - Abnormal; Notable for the following components:      Result Value   CO2 20 (*)    Glucose, Bld 118 (*)    BUN 28 (*)    Creatinine, Ser 1.50 (*)    GFR calc non Af Amer 50 (*)    GFR calc Af Amer 57 (*)    All other components within normal limits  CBC  TROPONIN I  TSH  T4, FREE    EKG None   ED ECG REPORT   Date: 03/23/2019  Rate: 106  Rhythm: sinus tachycardia and premature atrial contractions (PAC)  QRS Axis: normal  Intervals: normal  ST/T Wave abnormalities: normal  Conduction Disutrbances:none  Narrative Interpretation:   Old EKG Reviewed: unchanged  I have personally reviewed the EKG tracing and agree with the computerized printout as noted.   Radiology Dg Chest 2 View  Result Date: 03/23/2019 CLINICAL DATA:  Palpitations EXAM: CHEST - 2 VIEW COMPARISON:  03/11/2019 FINDINGS: The heart size and mediastinal contours are within normal limits. Both lungs are clear. The visualized skeletal structures are unremarkable. IMPRESSION: No active cardiopulmonary disease. Electronically Signed   By: Deatra Robinson M.D.   On: 03/23/2019 00:48    Procedures Procedures (including critical care time)  Medications Ordered in ED Medications  metoprolol  tartrate (LOPRESSOR) injection 2.5 mg (2.5 mg Intravenous Given 03/23/19 0025)     Initial Impression / Assessment and Plan / ED Course  I have reviewed the triage vital signs and the nursing notes.  Pertinent labs & imaging results that were available during my care of the patient were reviewed by me and considered in my medical decision making (see chart for details).  63 year old male here with palpitations.  He was seen for same  about 10 days ago, symptoms felt to be related to caffeine intake.  Symptoms tonight began after eating several brownies.  He is awake, alert, appropriately oriented here.  Vitals are stable, heart rate is around 115.  He reports ongoing palpitations.  He has PACs noted on the monitor but no distinct arrhythmia.  His lungs are clear without any wheezes or rhonchi.  EKG here without any acute ischemic changes.  Plan for screening labs including TSH, portable chest.  Will give low-dose Lopressor as patient seems to have responded well to this previously.  Patient's heart rate has returned to 80s after Lopressor, states he is feeling significantly better.  States he occasionally feels a flutter but decreased significantly from prior.  BP is a little soft now but MAPs are appropriate and he continues mentating well.  His work-up is overall reassuring.  I have low suspicion for ACS, PE, dissection, acute cardiac event.  I feel he stable for discharge home.  We will have him continue monitoring caffeine intake, he was made aware that chocolate does contain caffeine.  He has follow-up pending with cardiology at the [place].  I will defer any medication adjustments to them as blood pressure may become too low if adding beta-blocker.  He will return here for any new or acute changes.  Final Clinical Impressions(s) / ED Diagnoses   Final diagnoses:  Palpitations    ED Discharge Orders    None       Garlon Hatchet, PA-C 03/23/19 0411    Shon Baton, MD 03/23/19 [id number]

## 2019-09-09 ENCOUNTER — Other Ambulatory Visit: Payer: Self-pay

## 2019-09-09 DIAGNOSIS — Z20822 Contact with and (suspected) exposure to covid-19: Secondary | ICD-10-CM

## 2019-09-11 LAB — NOVEL CORONAVIRUS, NAA: SARS-CoV-2, NAA: NOT DETECTED

## 2020-01-23 ENCOUNTER — Ambulatory Visit: Payer: Medicaid Other | Admitting: Neurology

## 2020-02-08 ENCOUNTER — Encounter: Payer: Self-pay | Admitting: *Deleted

## 2020-02-09 ENCOUNTER — Ambulatory Visit (INDEPENDENT_AMBULATORY_CARE_PROVIDER_SITE_OTHER): Payer: Medicaid Other | Admitting: Neurology

## 2020-02-09 ENCOUNTER — Telehealth: Payer: Self-pay | Admitting: Neurology

## 2020-02-09 ENCOUNTER — Other Ambulatory Visit: Payer: Self-pay

## 2020-02-09 ENCOUNTER — Encounter: Payer: Self-pay | Admitting: Neurology

## 2020-02-09 VITALS — BP 144/96 | HR 91 | Temp 97.9°F | Ht 69.0 in | Wt 206.5 lb

## 2020-02-09 DIAGNOSIS — Z87820 Personal history of traumatic brain injury: Secondary | ICD-10-CM

## 2020-02-09 DIAGNOSIS — R413 Other amnesia: Secondary | ICD-10-CM | POA: Diagnosis not present
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
## 2020-02-09 NOTE — Telephone Encounter (Signed)
Medicare/VA order sent to GI. No auth they will reach out to the patient to schedule.

## 2020-02-10 DIAGNOSIS — Z87820 Personal history of traumatic brain injury: Secondary | ICD-10-CM | POA: Insufficient documentation

## 2020-02-10 LAB — COMPREHENSIVE METABOLIC PANEL
ALT: 14 IU/L (ref 0–44)
AST: 17 IU/L (ref 0–40)
Albumin/Globulin Ratio: 2.2 (ref 1.2–2.2)
Albumin: 4.7 g/dL (ref 3.8–4.8)
Alkaline Phosphatase: 63 IU/L (ref 39–117)
BUN/Creatinine Ratio: 19 (ref 10–24)
BUN: 24 mg/dL (ref 8–27)
Bilirubin Total: 0.3 mg/dL (ref 0.0–1.2)
CO2: 23 mmol/L (ref 20–29)
Calcium: 9.7 mg/dL (ref 8.6–10.2)
Chloride: 103 mmol/L (ref 96–106)
Creatinine, Ser: 1.27 mg/dL (ref 0.76–1.27)
GFR calc Af Amer: 70 mL/min/{1.73_m2} (ref >59)
GFR calc non Af Amer: 60 mL/min/{1.73_m2} (ref >59)
Globulin, Total: 2.1 g/dL (ref 1.5–4.5)
Glucose: 112 mg/dL — ABNORMAL HIGH (ref 65–99)
Potassium: 4.8 mmol/L (ref 3.5–5.2)
Sodium: 142 mmol/L (ref 134–144)
Total Protein: 6.8 g/dL (ref 6.0–8.5)

## 2020-02-10 LAB — CBC WITH DIFFERENTIAL
Basophils Absolute: 0 10*3/uL (ref 0.0–0.2)
Basos: 0 %
EOS (ABSOLUTE): 0.1 10*3/uL (ref 0.0–0.4)
Eos: 1 %
Hematocrit: 44 % (ref 37.5–51.0)
Hemoglobin: 14.7 g/dL (ref 13.0–17.7)
Immature Grans (Abs): 0 10*3/uL (ref 0.0–0.1)
Immature Granulocytes: 1 %
Lymphocytes Absolute: 1.4 10*3/uL (ref 0.7–3.1)
Lymphs: 16 %
MCH: 31.8 pg (ref 26.6–33.0)
MCHC: 33.4 g/dL (ref 31.5–35.7)
MCV: 95 fL (ref 79–97)
Monocytes Absolute: 0.8 10*3/uL (ref 0.1–0.9)
Monocytes: 10 %
Neutrophils Absolute: 6.3 10*3/uL (ref 1.4–7.0)
Neutrophils: 72 %
RBC: 4.62 x10E6/uL (ref 4.14–5.80)
RDW: 13.1 % (ref 11.6–15.4)
WBC: 8.7 10*3/uL (ref 3.4–10.8)

## 2020-02-10 LAB — TSH: TSH: 1.55 u[IU]/mL (ref 0.450–4.500)

## 2020-02-10 LAB — VITAMIN B12: Vitamin B-12: 594 pg/mL (ref 232–1245)

## 2020-02-10 LAB — RPR: RPR Ser Ql: NONREACTIVE

## 2020-03-08 ENCOUNTER — Other Ambulatory Visit: Payer: Non-veteran care

## 2020-06-19 IMAGING — DX CHEST - 2 VIEW
2 series · 2 of 2 positions shown · non-contrast
Comparison: 07/01/2005

CLINICAL DATA: Chest pain

EXAM:
CHEST - 2 VIEW

[chest pa]
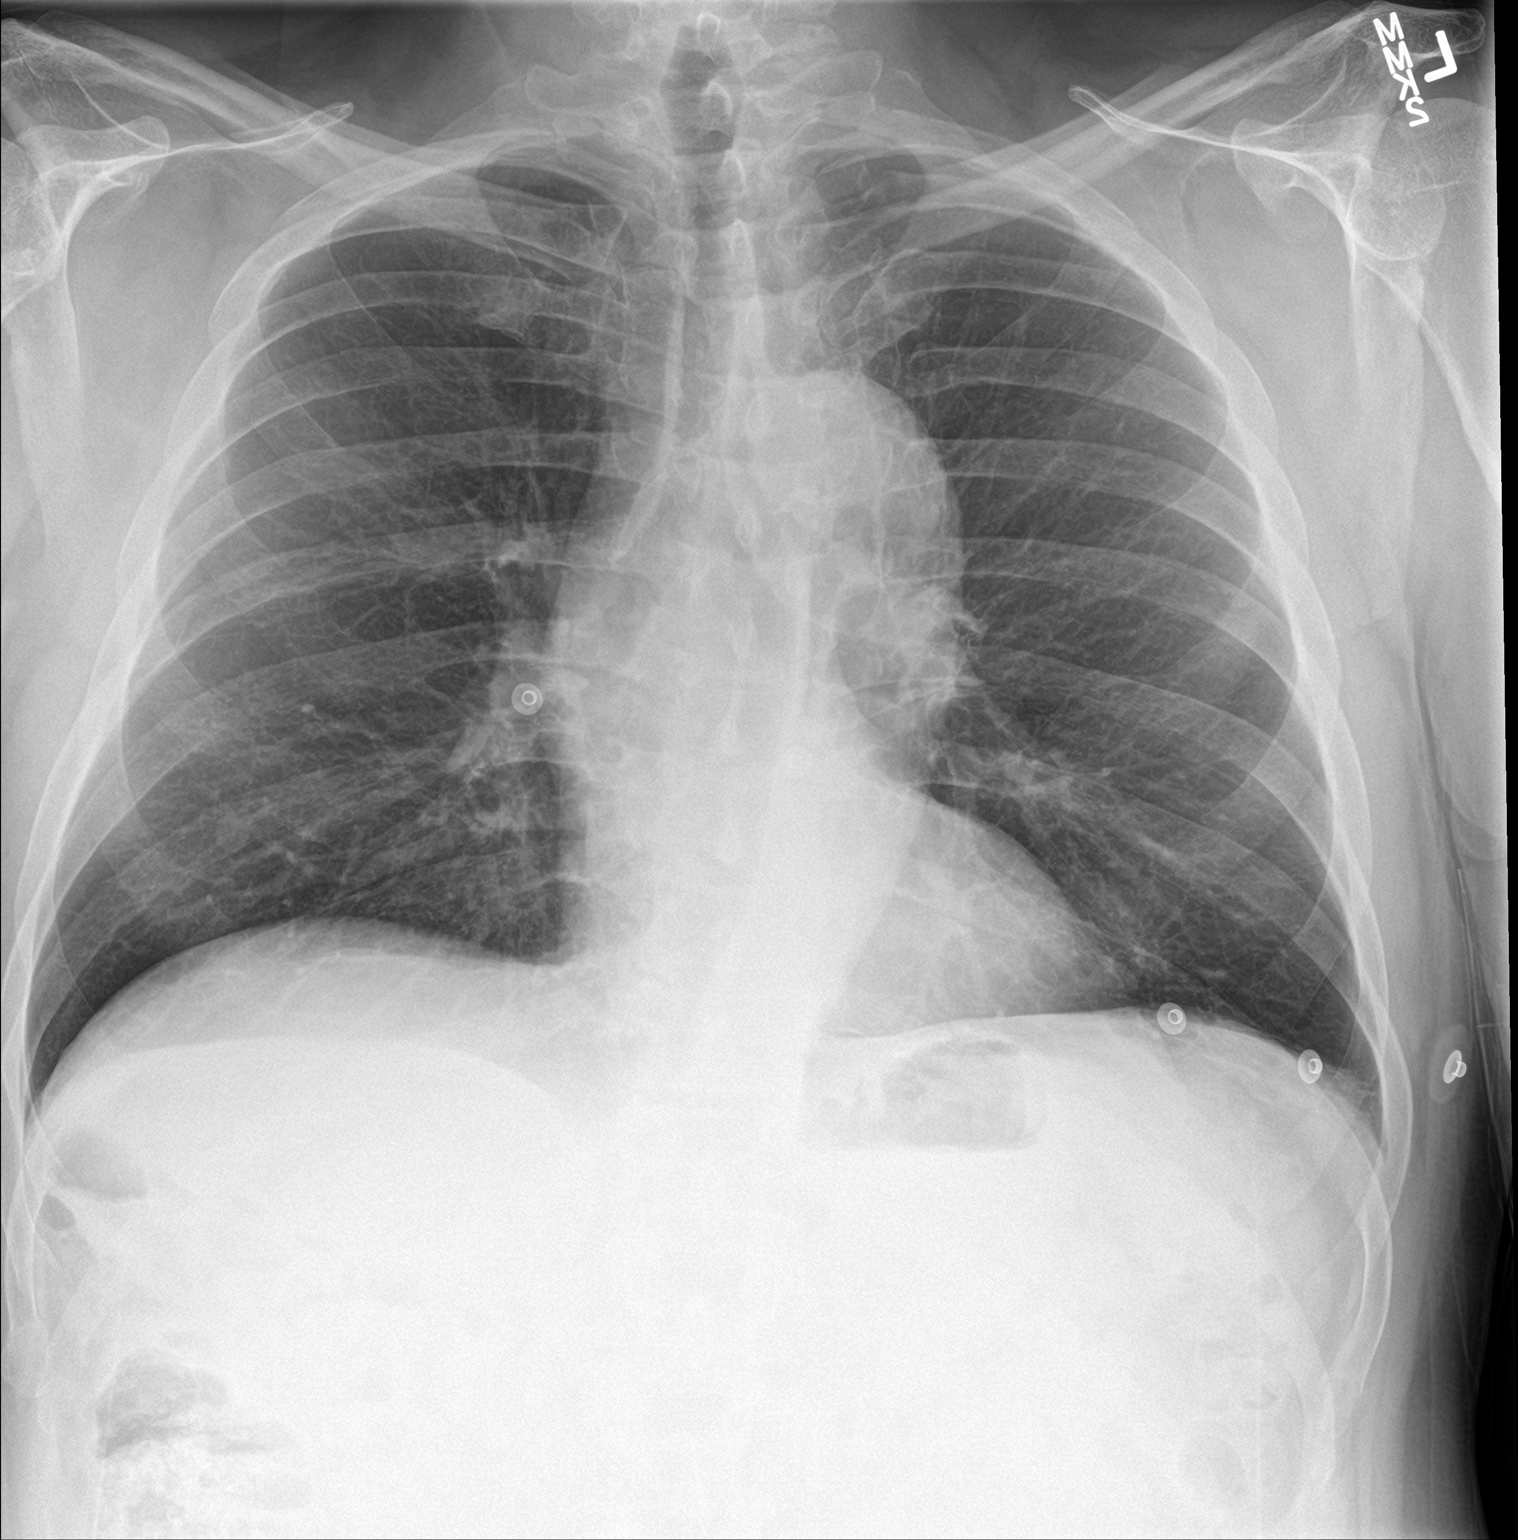

[chest lat]
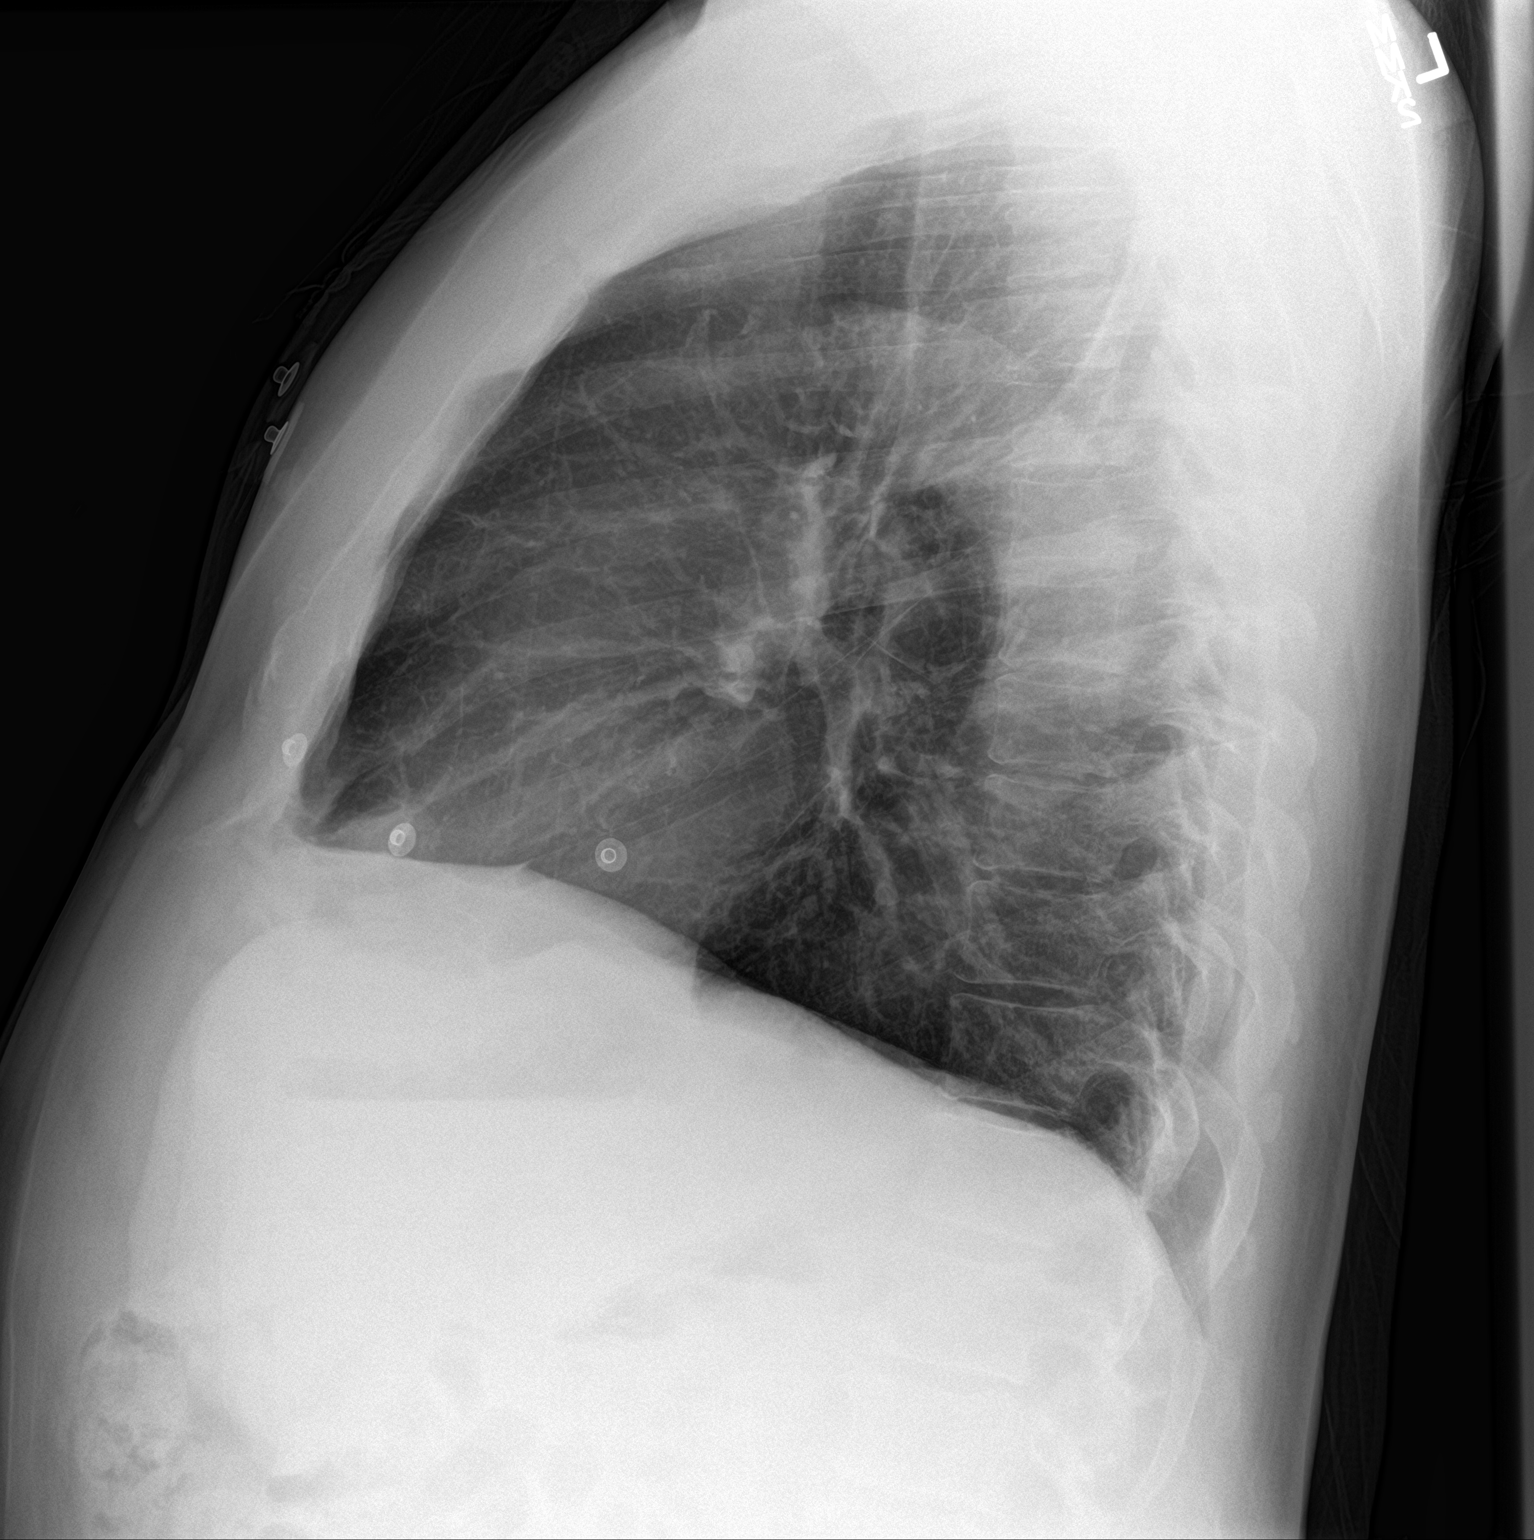

[2 of 2 positions shown; findings below may reference images not displayed]

FINDINGS: The heart size and mediastinal contours are within normal limits.
Both lungs are clear. The visualized skeletal structures are
unremarkable.
IMPRESSION: No active cardiopulmonary disease.

## 2020-07-01 IMAGING — CR CHEST - 2 VIEW
2 series · 2 of 2 positions shown · non-contrast
Comparison: 03/11/2019

CLINICAL DATA: Palpitations

EXAM:
CHEST - 2 VIEW

[chest pa]
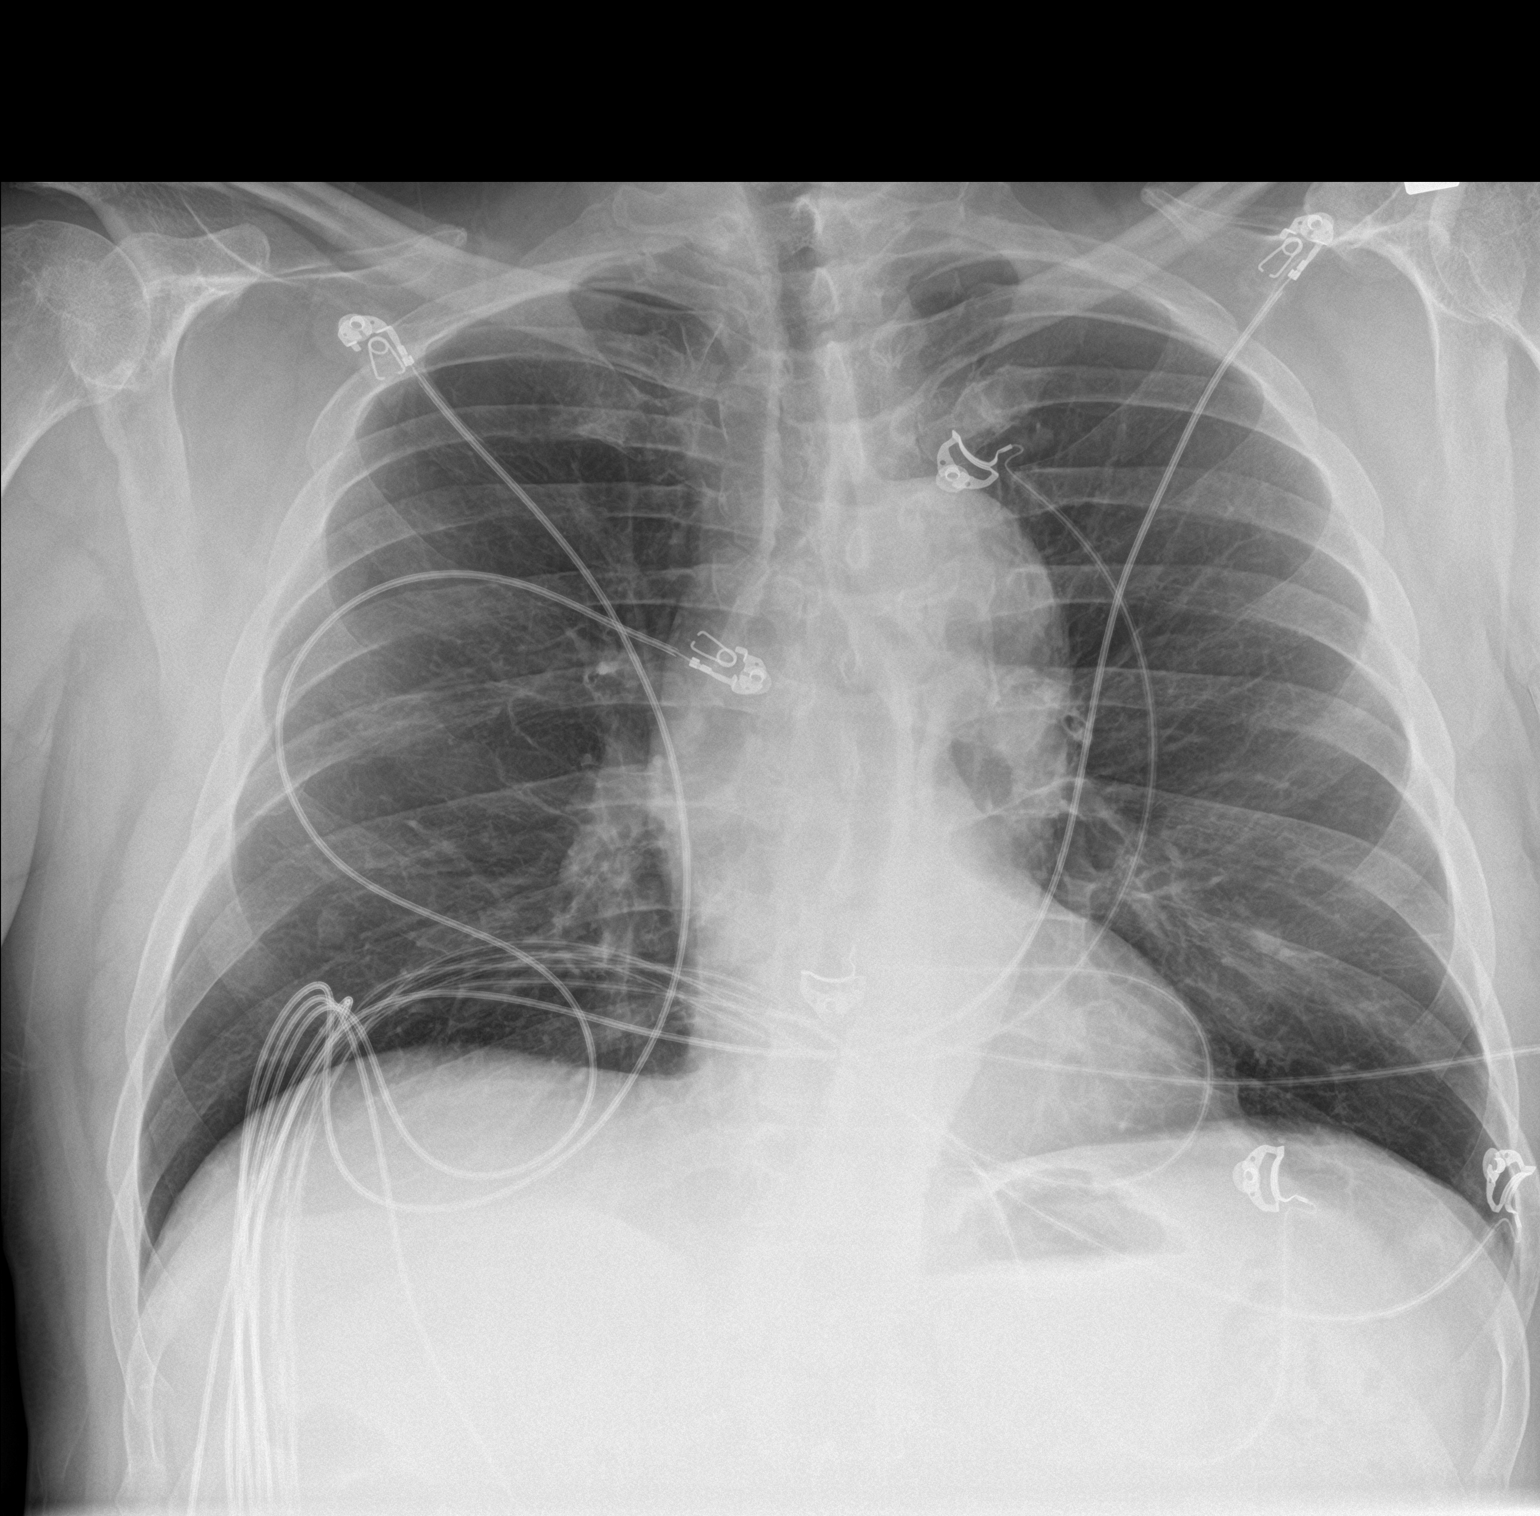

[chest lat]
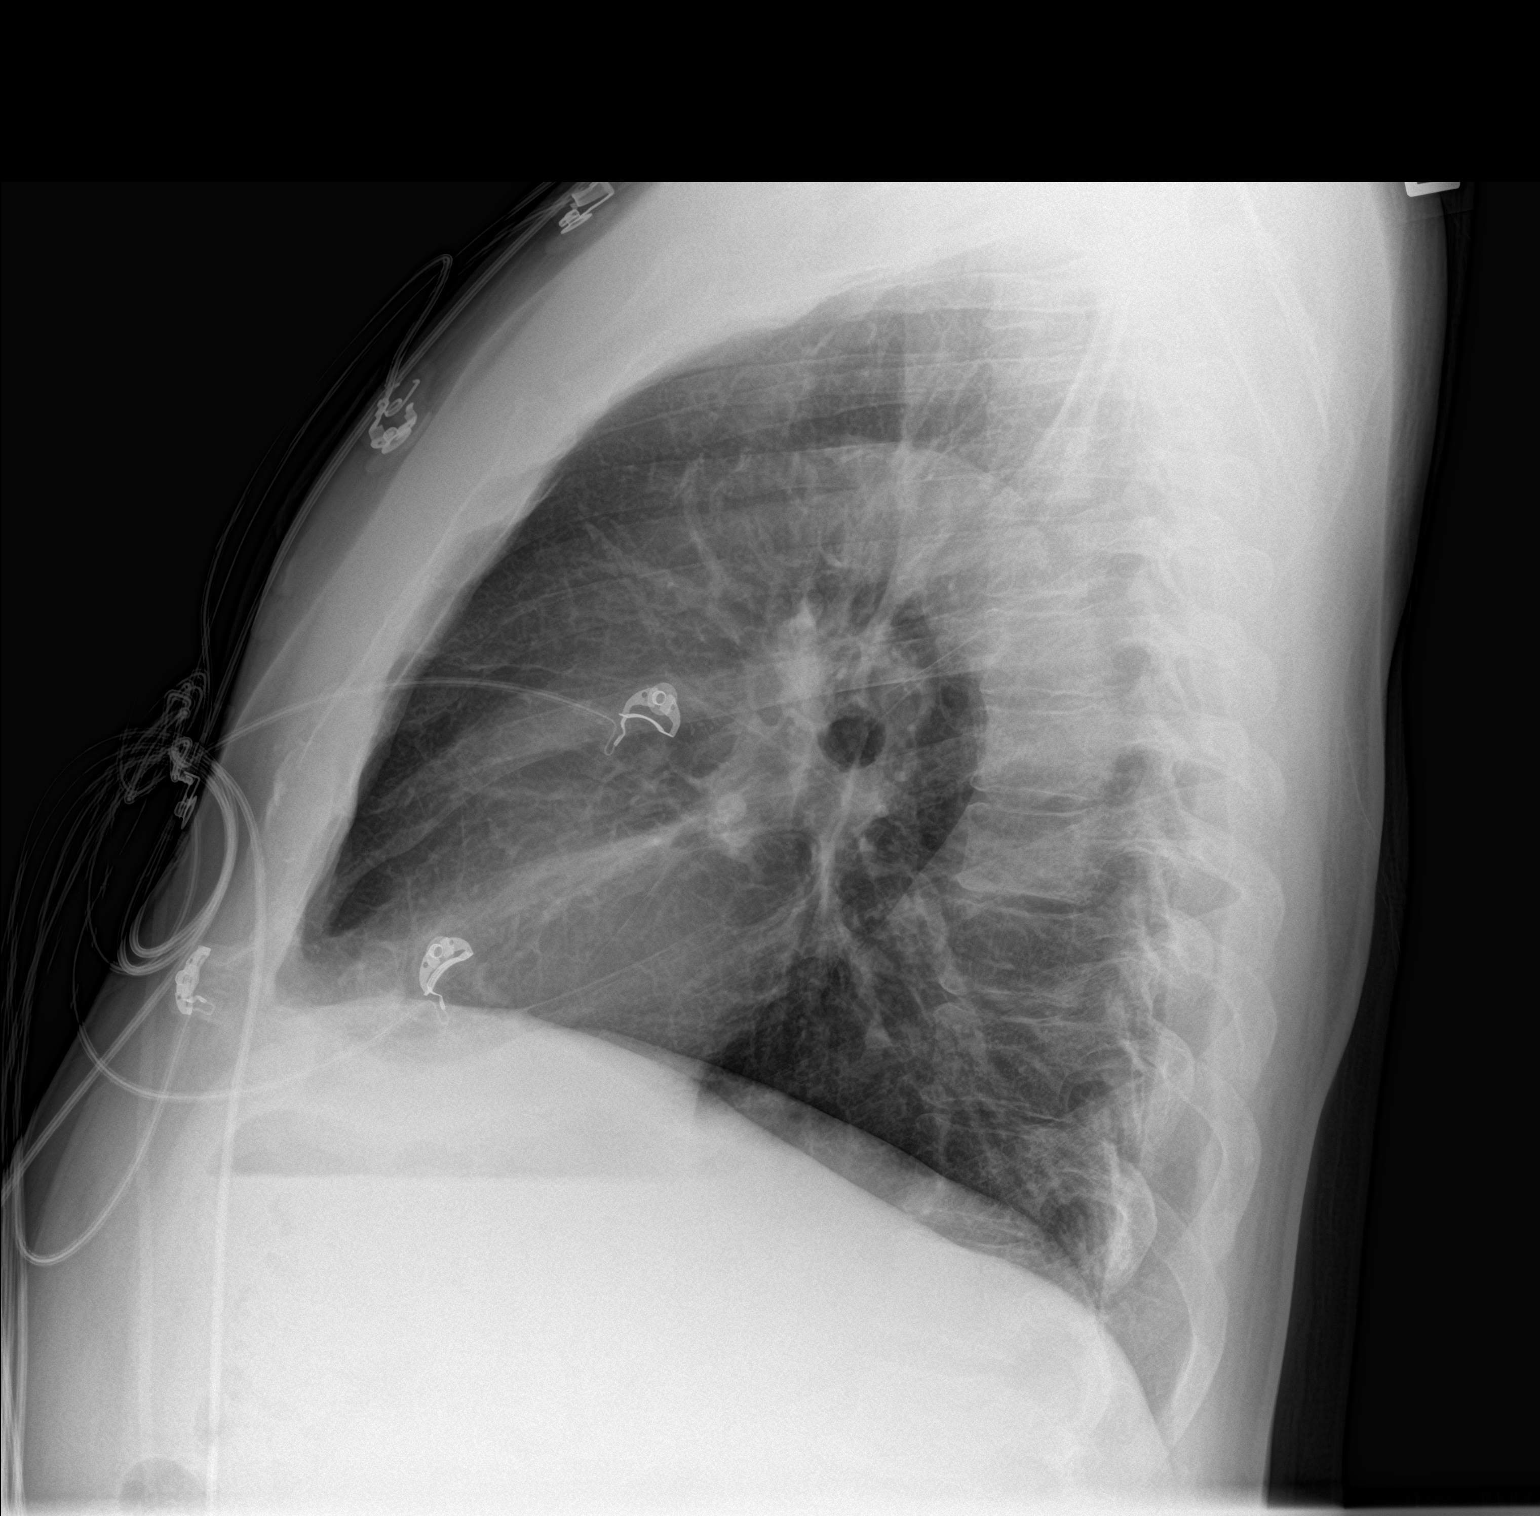

[2 of 2 positions shown; findings below may reference images not displayed]

FINDINGS: The heart size and mediastinal contours are within normal limits.
Both lungs are clear. The visualized skeletal structures are
unremarkable.
IMPRESSION: No active cardiopulmonary disease.

## 2020-08-09 ENCOUNTER — Encounter: Payer: Self-pay | Admitting: Neurology

## 2020-08-09 ENCOUNTER — Ambulatory Visit: Payer: Self-pay | Admitting: Neurology

## 2020-08-09 NOTE — Progress Notes (Deleted)
PATIENT: [name] DOB: 11/29/1956  REASON FOR VISIT: follow up HISTORY FROM: patient  HISTORY OF PRESENT ILLNESS: Today 08/09/20  HISTORY   [name] is a 64 year old male, seen in request by his primary care at adult and pediatric clinic for evaluation of gait abnormality, memory loss, he is also a patient of Lenn Sink, initial evaluation was on February 09, 2020.  I have reviewed and summarized the referring note from the referring physician.  He has past medical history of hypertension, depression, on polypharmacy treatment, BuSpar 10 mg 3 times a day, Wellbutrin 150 mg twice a day, gabapentin 100 mg 3 times a day, Prozac 20 mg daily, he retired from [employer], stationed at [employer] from 19 82-19 85, used to work at Holiday representative work.  He developed right lumbar radiculopathy, severe radiating pain from the right lower back to right lower extremity require decompressive surgery in August 2019, has been on disability since then.  He also had a history of alcohol use, sometimes binge drinker, he reported last drink was in August 2019.  He also suffered insult to his head in 2016, lost vision of his right eye.  He currently lives at transitional housing since 2019, he was quite mild memory loss, but he gave a very good detailed medical history, Mini-Mental Status Examination 29/30, animal naming of 14, he also complains of gait abnormality, came in with a cane, but he was able to move very significantly without any assistance.  Laboratory evaluation in 2020, normal TSH, CBC, BMP showed abnormal kidney function with creatinine of 1.5, GFR of 50  Update August 09, 2020 SS: At last visit, MRI of the brain was ordered, but has not been completed.  Laboratory evaluation including B12, RPR, TSH, CBC, CMP showed no significant abnormalities.  REVIEW OF SYSTEMS: Out of a complete 14 system review of symptoms, the patient complains only of the following symptoms,  and all other reviewed systems are negative.  ALLERGIES: No Known Allergies  HOME MEDICATIONS: Outpatient Medications Prior to Visit  Medication Sig Dispense Refill  . acetaminophen (TYLENOL) 500 MG tablet Take 500 mg by mouth 3 (three) times daily as needed for mild pain.    Marland Kitchen amLODipine (NORVASC) 5 MG tablet Take 5 mg by mouth daily.     Marland Kitchen buPROPion (WELLBUTRIN SR) 150 MG 12 hr tablet Take 150 mg by mouth 2 (two) times daily.    . busPIRone (BUSPAR) 10 MG tablet Take 10 mg by mouth 3 (three) times daily.    Marland Kitchen FLUoxetine (PROZAC) 20 MG capsule Take 20 mg by mouth daily.    Marland Kitchen gabapentin (NEURONTIN) 100 MG capsule Take 100 mg by mouth 3 (three) times daily.    Marland Kitchen lisinopril-hydrochlorothiazide (PRINZIDE,ZESTORETIC) 20-12.5 MG per tablet Take 1 tablet by mouth daily.    . Melatonin 3 MG TABS Take 6 mg by mouth at bedtime.    . nicotine polacrilex (COMMIT) 2 MG lozenge Take 2 mg by mouth every 2 (two) hours as needed for smoking cessation.     Bertram Gala Glycol-Propyl Glycol (SYSTANE ULTRA) 0.4-0.3 % SOLN Place 1 drop into both eyes 3 (three) times daily.    . Throat Lozenges (COUGH DROPS MENTHOL MT) Take 1 drop by mouth 5 (five) times daily as needed (for throat dryness).      No facility-administered medications prior to visit.    PAST MEDICAL HISTORY: Past Medical History:  Diagnosis Date  . Depression   . Dry eyes   .  Gait abnormality   . Hypertension   . Joint pain   . Memory changes     PAST SURGICAL HISTORY: Past Surgical History:  Procedure Laterality Date  . BACK SURGERY  08/2018   mult locations, 1 surgery  . HAND SURGERY      FAMILY HISTORY: Family History  Problem Relation Age of Onset  . Asthma Mother   . Mental illness Mother   . Osteoporosis Mother   . Colon cancer Mother   . Heart disease Father   . Hypertension Father   . Hypercholesterolemia Father   . Diabetes Father     SOCIAL HISTORY: Social History   Socioeconomic History  . Marital  status: Divorced    Spouse name: Not on file  . Number of children: 1  . Years of education: Not on file  . Highest education level: Some college, no degree  Occupational History    Comment: disabled, retired Arts development officer  Tobacco Use  . Smoking status: Former Smoker    Packs/day: 1.00    Types: Cigarettes    Quit date: 12/11/2018    Years since quitting: 1.6  . Smokeless tobacco: Never Used  Vaping Use  . Vaping Use: Some days  Substance and Sexual Activity  . Alcohol use: Yes    Comment: Pt reports social, hx abuse- quit 07/2018  . Drug use: No  . Sexual activity: Not Currently  Other Topics Concern  . Not on file  Social History Narrative   02/09/20 Lives in transitional housing for vets   Social Determinants of Health   Financial Resource Strain:   . Difficulty of Paying Living Expenses: Not on file  Food Insecurity:   . Worried About Programme researcher, broadcasting/film/video in the Last Year: Not on file  . Ran Out of Food in the Last Year: Not on file  Transportation Needs:   . Lack of Transportation (Medical): Not on file  . Lack of Transportation (Non-Medical): Not on file  Physical Activity:   . Days of Exercise per Week: Not on file  . Minutes of Exercise per Session: Not on file  Stress:   . Feeling of Stress : Not on file  Social Connections:   . Frequency of Communication with Friends and Family: Not on file  . Frequency of Social Gatherings with Friends and Family: Not on file  . Attends Religious Services: Not on file  . Active Member of Clubs or Organizations: Not on file  . Attends Banker Meetings: Not on file  . Marital Status: Not on file  Intimate Partner Violence:   . Fear of Current or Ex-Partner: Not on file  . Emotionally Abused: Not on file  . Physically Abused: Not on file  . Sexually Abused: Not on file      PHYSICAL EXAM  There were no vitals filed for this visit. There is no height or weight on file to calculate BMI.  Generalized: Well  developed, in no acute distress   Neurological examination  Mentation: Alert oriented to time, place, history taking. Follows all commands speech and language fluent Cranial nerve II-XII: Pupils were equal round reactive to light. Extraocular movements were full, visual field were full on confrontational test. Facial sensation and strength were normal. Uvula tongue midline. Head turning and shoulder shrug  were normal and symmetric. Motor: The motor testing reveals 5 over 5 strength of all 4 extremities. Good symmetric motor tone is noted throughout.  Sensory: Sensory testing is intact to soft  touch on all 4 extremities. No evidence of extinction is noted.  Coordination: Cerebellar testing reveals good finger-nose-finger and heel-to-shin bilaterally.  Gait and station: Gait is normal. Tandem gait is normal. Romberg is negative. No drift is seen.  Reflexes: Deep tendon reflexes are symmetric and normal bilaterally.   DIAGNOSTIC DATA (LABS, IMAGING, TESTING) - I reviewed patient records, labs, notes, testing and imaging myself where available.  Lab Results  Component Value Date   WBC 8.7 02/09/2020   HGB 14.7 02/09/2020   HCT 44.0 02/09/2020   MCV 95 02/09/2020   PLT 267 03/23/2019      Component Value Date/Time   NA 142 02/09/2020 0924   K 4.8 02/09/2020 0924   CL 103 02/09/2020 0924   CO2 23 02/09/2020 0924   GLUCOSE 112 (H) 02/09/2020 0924   GLUCOSE 118 (H) 03/23/2019 0008   BUN 24 02/09/2020 0924   CREATININE 1.27 02/09/2020 0924   CALCIUM 9.7 02/09/2020 0924   PROT 6.8 02/09/2020 0924   ALBUMIN 4.7 02/09/2020 0924   AST 17 02/09/2020 0924   ALT 14 02/09/2020 0924   ALKPHOS 63 02/09/2020 0924   BILITOT 0.3 02/09/2020 0924   GFRNONAA 60 02/09/2020 0924   GFRAA 70 02/09/2020 0924   No results found for: CHOL, HDL, LDLCALC, LDLDIRECT, TRIG, CHOLHDL No results found for: UXLK4M Lab Results  Component Value Date   VITAMINB12 594 02/09/2020   Lab Results  Component Value  Date   TSH 1.550 02/09/2020      ASSESSMENT AND PLAN 64 y.o. year old male  has a past medical history of Depression, Dry eyes, Gait abnormality, Hypertension, Joint pain, and Memory changes. here with ***   I spent 15 minutes with the patient. 50% of this time was spent   Margie Ege, [place], DNP 08/09/2020, 5:34 AM [facility] [address], [address] [place], [place] [address] [phone]

## 2022-05-06 ENCOUNTER — Encounter: Payer: Non-veteran care | Admitting: Vascular Surgery

## 2022-05-19 NOTE — Progress Notes (Unsigned)
[facility] [place]  ASSESSMENT / PLAN: 66 y.o. 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 I reassured the patient about the benign nature of this finding.  We will follow this up with a another duplex ultrasound of the abdomen.  If the lesion is redemonstrated, we will monitor this as an outpatient.  If no evidence is found, we will leave his follow-up open-ended.  CHIEF COMPLAINT: Abnormal finding on recent scan  HISTORY OF PRESENT ILLNESS: [name] is a 66 y.o. male clinic for evaluation of abnormality on recent CT scan done at the [place].  The scans are not available to me, but the reports are.  The patient has no symptoms referable to his abdomen.  He reports no abdominal discomfort. The bulk of our visit was spent discussing these incidental findings, the rationale for surveillance, and the threshold for intervention.   Past Medical History:  Diagnosis Date   Depression    Dry eyes    Gait abnormality    Hypertension    Joint pain    Memory changes     Past Surgical History:  Procedure Laterality Date   BACK SURGERY  08/2018   mult locations, 1 surgery   HAND SURGERY      Family History  Problem Relation Age of Onset   Asthma Mother    Mental illness Mother    Osteoporosis Mother    Colon cancer Mother    Heart disease Father    Hypertension Father    Hypercholesterolemia Father    Diabetes Father     Social History   Socioeconomic History   Marital status: Divorced    Spouse name: Not on file   Number of children: 1   Years of education: Not on file   Highest education level: Some college, no degree  Occupational History    Comment: disabled, retired Arts development officer  Tobacco Use   Smoking status: Former    Packs/day: 1.00    Types: Cigarettes    Quit date: 12/11/2018    Years since quitting: 3.4   Smokeless tobacco: Never  Vaping Use   Vaping Use: Some days  Substance and Sexual Activity   Alcohol use:  Yes    Comment: Pt reports social, hx abuse- quit 07/2018   Drug use: No   Sexual activity: Not Currently  Other Topics Concern   Not on file  Social History Narrative   02/09/20 Lives in transitional housing for vets   Social Determinants of Health   Financial Resource Strain: Not on file  Food Insecurity: Not on file  Transportation Needs: Not on file  Physical Activity: Not on file  Stress: Not on file  Social Connections: Not on file  Intimate Partner Violence: Not on file    No Known Allergies  Current Outpatient Medications  Medication Sig Dispense Refill   acetaminophen (TYLENOL) 500 MG tablet Take 500 mg by mouth 3 (three) times daily as needed for mild pain.     amLODipine (NORVASC) 5 MG tablet Take 5 mg by mouth daily.      buPROPion (WELLBUTRIN SR) 150 MG 12 hr tablet Take 150 mg by mouth 2 (two) times daily.     busPIRone (BUSPAR) 10 MG tablet Take 10 mg by mouth 3 (three) times daily.     FLUoxetine (PROZAC) 20 MG capsule Take 20 mg by mouth daily.     gabapentin (NEURONTIN) 100 MG capsule Take 100 mg by mouth 3 (  three) times daily.     lisinopril-hydrochlorothiazide (PRINZIDE,ZESTORETIC) 20-12.5 MG per tablet Take 1 tablet by mouth daily.     Melatonin 3 MG TABS Take 6 mg by mouth at bedtime.     nicotine polacrilex (COMMIT) 2 MG lozenge Take 2 mg by mouth every 2 (two) hours as needed for smoking cessation.      Polyethyl Glycol-Propyl Glycol (SYSTANE ULTRA) 0.4-0.3 % SOLN Place 1 drop into both eyes 3 (three) times daily.     Throat Lozenges (COUGH DROPS MENTHOL MT) Take 1 drop by mouth 5 (five) times daily as needed (for throat dryness).      No current facility-administered medications for this visit.    PHYSICAL EXAM Vitals:   05/20/22 1243  BP: (!) 128/92  Pulse: 78  Resp: 20  Temp: 98.4 F (36.9 C)  SpO2: 98%  Weight: 198 lb (89.8 kg)  Height: 5\' 9"  (1.753 m)    Constitutional: well appearing. No distress. Cardiac: regular rate and rhythm.   Respiratory:  unlabored. Abdominal:  soft, non-tender, non-distended.    PERTINENT LABORATORY AND RADIOLOGIC DATA  Most recent CBC    Latest Ref Rng & Units 02/09/2020    9:24 AM 03/23/2019   12:08 AM 03/11/2019    5:12 PM  CBC  WBC 3.4 - 10.8 x10E3/uL 8.7  5.5  6.8   Hemoglobin 13.0 - 17.7 g/dL 05/11/2019  75.6  43.3   Hematocrit 37.5 - 51.0 % 44.0  42.3  43.0   Platelets 150 - 400 K/uL  267  270      Most recent CMP    Latest Ref Rng & Units 02/09/2020    9:24 AM 03/23/2019   12:08 AM 03/11/2019    5:12 PM  CMP  Glucose 65 - 99 mg/dL 05/11/2019  188  96   BUN 8 - 27 mg/dL 24  28  23    Creatinine 0.76 - 1.27 mg/dL 416   6.06   Sodium 134 - 144 mmol/L 142  140  139   Potassium 3.5 - 5.2 mmol/L 4.8  3.9  3.9   Chloride 96 - 106 mmol/L 103  107  106   CO2 20 - 29 mmol/L 23  20  21    Calcium 8.6 - 10.2 mg/dL 9.7  9.4  9.5   Total Protein 6.0 - 8.5 g/dL 6.8     Total Bilirubin 0.0 - 1.2 mg/dL 0.3     Alkaline Phos 39 - 117 IU/L 63     AST 0 - 40 IU/L 17     ALT 0 - 44 IU/L 14      CT scan from outside facility reports 6mm x 73mm thrombosed saccular aneurysm about celiac axis.   . 16m, MD [facility] Phone Number: [phone] 05/20/2022 2:49 PM  Total time spent on preparing this encounter including chart review, data review, collecting history, examining the patient, coordinating care for this new patient, 45 minutes.  Portions of this report may have been transcribed using voice recognition software.  Every effort has been made to ensure accuracy; however, inadvertent computerized transcription errors may still be present.

## 2022-05-20 ENCOUNTER — Encounter: Payer: Self-pay | Admitting: Vascular Surgery

## 2022-05-20 ENCOUNTER — Ambulatory Visit (INDEPENDENT_AMBULATORY_CARE_PROVIDER_SITE_OTHER): Payer: No Typology Code available for payment source | Admitting: Vascular Surgery

## 2022-05-20 VITALS — BP 128/92 | HR 78 | Temp 98.4°F | Resp 20 | Ht 69.0 in | Wt 198.0 lb

## 2022-05-20 DIAGNOSIS — I728 Aneurysm of other specified arteries: Secondary | ICD-10-CM

## 2022-05-23 ENCOUNTER — Other Ambulatory Visit: Payer: Self-pay

## 2022-05-23 DIAGNOSIS — I728 Aneurysm of other specified arteries: Secondary | ICD-10-CM

## 2022-12-06 DIAGNOSIS — Z5900 Homelessness unspecified: Secondary | ICD-10-CM | POA: Insufficient documentation

## 2022-12-06 DIAGNOSIS — I10 Essential (primary) hypertension: Secondary | ICD-10-CM | POA: Insufficient documentation

## 2022-12-06 DIAGNOSIS — K649 Unspecified hemorrhoids: Secondary | ICD-10-CM | POA: Insufficient documentation

## 2022-12-06 DIAGNOSIS — R3129 Other microscopic hematuria: Secondary | ICD-10-CM | POA: Insufficient documentation

## 2022-12-06 DIAGNOSIS — R7303 Prediabetes: Secondary | ICD-10-CM | POA: Insufficient documentation

## 2022-12-06 DIAGNOSIS — Z7729 Contact with and (suspected ) exposure to other hazardous substances: Secondary | ICD-10-CM | POA: Insufficient documentation

## 2022-12-06 DIAGNOSIS — N179 Acute kidney failure, unspecified: Secondary | ICD-10-CM | POA: Insufficient documentation

## 2022-12-06 DIAGNOSIS — H2513 Age-related nuclear cataract, bilateral: Secondary | ICD-10-CM | POA: Insufficient documentation

## 2022-12-06 DIAGNOSIS — F17200 Nicotine dependence, unspecified, uncomplicated: Secondary | ICD-10-CM | POA: Insufficient documentation

## 2022-12-06 DIAGNOSIS — F331 Major depressive disorder, recurrent, moderate: Secondary | ICD-10-CM | POA: Insufficient documentation

## 2022-12-06 DIAGNOSIS — F1010 Alcohol abuse, uncomplicated: Secondary | ICD-10-CM | POA: Insufficient documentation

## 2022-12-06 DIAGNOSIS — F339 Major depressive disorder, recurrent, unspecified: Secondary | ICD-10-CM | POA: Insufficient documentation

## 2022-12-06 DIAGNOSIS — Z7189 Other specified counseling: Secondary | ICD-10-CM | POA: Insufficient documentation

## 2022-12-06 DIAGNOSIS — B20 Human immunodeficiency virus [HIV] disease: Secondary | ICD-10-CM | POA: Insufficient documentation

## 2022-12-06 DIAGNOSIS — G4733 Obstructive sleep apnea (adult) (pediatric): Secondary | ICD-10-CM | POA: Insufficient documentation

## 2023-03-30 NOTE — Progress Notes (Signed)
[facility] [place]  ASSESSMENT / PLAN: 67 y.o. 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 No evidence of aneurysm on duplex today. I will see him again in 2 years with a CT angiogram to ensure this has resolved.   CHIEF COMPLAINT: Abnormal finding on recent scan  HISTORY OF PRESENT ILLNESS: [name] is a 67 y.o. male clinic for evaluation of abnormality on recent CT scan done at the [place].  The scans are not available to me, but the reports are.  The patient has no symptoms referable to his abdomen.  He reports no abdominal discomfort. The bulk of our visit was spent discussing these incidental findings, the rationale for surveillance, and the threshold for intervention.   03/31/23: Doing well overall.  No abdominal complaints.  We reviewed his duplex today in detail.  Past Medical History:  Diagnosis Date   Depression    Dry eyes    Gait abnormality    Hypertension    Joint pain    Memory changes     Past Surgical History:  Procedure Laterality Date   BACK SURGERY  08/2018   mult locations, 1 surgery   HAND SURGERY      Family History  Problem Relation Age of Onset   Asthma Mother    Mental illness Mother    Osteoporosis Mother    Colon cancer Mother    Heart disease Father    Hypertension Father    Hypercholesterolemia Father    Diabetes Father     Social History   Socioeconomic History   Marital status: Divorced    Spouse name: Not on file   Number of children: 1   Years of education: Not on file   Highest education level: Some college, no degree  Occupational History    Comment: disabled, retired Arts development officer  Tobacco Use   Smoking status: Former    Packs/day: 1    Types: Cigarettes    Quit date: 12/11/2018    Years since quitting: 4.3   Smokeless tobacco: Never  Vaping Use   Vaping Use: Some days  Substance and Sexual Activity   Alcohol use: Yes    Comment: Pt reports social, hx abuse- quit 07/2018    Drug use: No   Sexual activity: Not Currently  Other Topics Concern   Not on file  Social History Narrative   02/09/20 Lives in transitional housing for vets   Social Determinants of Health   Financial Resource Strain: Not on file  Food Insecurity: Not on file  Transportation Needs: Not on file  Physical Activity: Not on file  Stress: Not on file  Social Connections: Not on file  Intimate Partner Violence: Not on file    No Known Allergies  Current Outpatient Medications  Medication Sig Dispense Refill   acetaminophen (TYLENOL) 500 MG tablet Take 500 mg by mouth 3 (three) times daily as needed for mild pain.     amLODipine (NORVASC) 5 MG tablet Take 5 mg by mouth daily.      buPROPion (WELLBUTRIN SR) 150 MG 12 hr tablet Take 150 mg by mouth 2 (two) times daily.     busPIRone (BUSPAR) 10 MG tablet Take 10 mg by mouth 3 (three) times daily.     FLUoxetine (PROZAC) 20 MG capsule Take 20 mg by mouth daily.     gabapentin (NEURONTIN) 100 MG capsule Take 100 mg by mouth 3 (three) times daily.     lisinopril-hydrochlorothiazide (  PRINZIDE,ZESTORETIC) 20-12.5 MG per tablet Take 1 tablet by mouth daily.     Melatonin 3 MG TABS Take 6 mg by mouth at bedtime.     nicotine polacrilex (COMMIT) 2 MG lozenge Take 2 mg by mouth every 2 (two) hours as needed for smoking cessation.      Polyethyl Glycol-Propyl Glycol (SYSTANE ULTRA) 0.4-0.3 % SOLN Place 1 drop into both eyes 3 (three) times daily.     Throat Lozenges (COUGH DROPS MENTHOL MT) Take 1 drop by mouth 5 (five) times daily as needed (for throat dryness).      No current facility-administered medications for this visit.    PHYSICAL EXAM There were no vitals filed for this visit.   Constitutional: well appearing. No distress. Cardiac: regular rate and rhythm.  Respiratory:  unlabored. Abdominal:  soft, non-tender, non-distended.    PERTINENT LABORATORY AND RADIOLOGIC DATA  Most recent CBC    Latest Ref Rng & Units 02/09/2020     9:24 AM 03/23/2019   12:08 AM 03/11/2019    5:12 PM  CBC  WBC 3.4 - 10.8 x10E3/uL 8.7  5.5  6.8   Hemoglobin 13.0 - 17.7 g/dL 16.1  09.6  04.5   Hematocrit 37.5 - 51.0 % 44.0  42.3  43.0   Platelets 150 - 400 K/uL  267  270      Most recent CMP    Latest Ref Rng & Units 02/09/2020    9:24 AM 03/23/2019   12:08 AM 03/11/2019    5:12 PM  CMP  Glucose 65 - 99 mg/dL 409  811  96   BUN 8 - 27 mg/dL 24  28  23    Creatinine 0.76 - 1.27 mg/dL 9.14  7.82  9.56   Sodium 134 - 144 mmol/L 142  140  139   Potassium 3.5 - 5.2 mmol/L 4.8  3.9  3.9   Chloride 96 - 106 mmol/L 103  107  106   CO2 20 - 29 mmol/L 23  20  21    Calcium 8.6 - 10.2 mg/dL 9.7  9.4  9.5   Total Protein 6.0 - 8.5 g/dL 6.8     Total Bilirubin 0.0 - 1.2 mg/dL 0.3     Alkaline Phos 39 - 117 IU/L 63     AST 0 - 40 IU/L 17     ALT 0 - 44 IU/L 14      CT scan from outside facility reports 18mm x 18mm thrombosed saccular aneurysm about celiac axis.    Mesenteric duplex:    No evidence of stenosis in the celiac artery or SMA. Celiac artery  aneurysm not  adequately visualized.   Rande Brunt. Lenell Antu, MD [facility] Phone Number: [phone] 03/30/2023 10:09 AM  Total time spent on preparing this encounter including chart review, data review, collecting history, examining the patient, coordinating care for this established patient, 20 minutes  Portions of this report may have been transcribed using voice recognition software.  Every effort has been made to ensure accuracy; however, inadvertent computerized transcription errors may still be present.

## 2023-03-31 ENCOUNTER — Ambulatory Visit (INDEPENDENT_AMBULATORY_CARE_PROVIDER_SITE_OTHER): Payer: No Typology Code available for payment source | Admitting: Vascular Surgery

## 2023-03-31 ENCOUNTER — Encounter: Payer: Self-pay | Admitting: Vascular Surgery

## 2023-03-31 ENCOUNTER — Ambulatory Visit (HOSPITAL_COMMUNITY)
Admission: RE | Admit: 2023-03-31 | Discharge: 2023-03-31 | Disposition: A | Discharge status: Home / Self Care | Payer: No Typology Code available for payment source | Source: Ambulatory Visit | Attending: Vascular Surgery | Admitting: Vascular Surgery

## 2023-03-31 VITALS — BP 150/96 | HR 58 | Temp 98.5°F | Resp 20 | Ht 69.0 in | Wt 202.0 lb

## 2023-03-31 DIAGNOSIS — I728 Aneurysm of other specified arteries: Secondary | ICD-10-CM
